# Patient Record
Sex: Male | Born: 1960 | Hispanic: Yes | Marital: Married
Health system: Southern US, Community
[De-identification: ages and names within clinical notes are randomized; demographics above are authoritative.]

## PROBLEM LIST (undated history)

## (undated) ENCOUNTER — Emergency Department (HOSPITAL_COMMUNITY): Payer: Self-pay

---

## 2000-01-28 ENCOUNTER — Encounter: Payer: Self-pay | Admitting: Emergency Medicine

## 2000-01-28 ENCOUNTER — Emergency Department (HOSPITAL_COMMUNITY): Admission: EM | Admit: 2000-01-28 | Discharge: 2000-01-29 | Payer: Self-pay | Admitting: Emergency Medicine

## 2014-02-04 ENCOUNTER — Emergency Department (HOSPITAL_COMMUNITY): Payer: Self-pay | Admitting: Certified Registered Nurse Anesthetist

## 2014-02-04 ENCOUNTER — Inpatient Hospital Stay (HOSPITAL_COMMUNITY)
Admission: EM | Admit: 2014-02-04 | Discharge: 2014-02-05 | DRG: 494 | Disposition: A | Discharge status: Home / Self Care | Payer: Self-pay | Attending: Orthopedic Surgery | Admitting: Orthopedic Surgery

## 2014-02-04 ENCOUNTER — Encounter (HOSPITAL_COMMUNITY)
Admission: EM | Disposition: A | Discharge status: Home / Self Care | Payer: Self-pay | Source: Home / Self Care | Attending: Orthopedic Surgery

## 2014-02-04 ENCOUNTER — Encounter (HOSPITAL_COMMUNITY): Payer: Self-pay | Admitting: *Deleted

## 2014-02-04 ENCOUNTER — Ambulatory Visit (INDEPENDENT_AMBULATORY_CARE_PROVIDER_SITE_OTHER): Payer: Self-pay

## 2014-02-04 ENCOUNTER — Ambulatory Visit (INDEPENDENT_AMBULATORY_CARE_PROVIDER_SITE_OTHER): Payer: Self-pay | Admitting: Family Medicine

## 2014-02-04 ENCOUNTER — Inpatient Hospital Stay (HOSPITAL_COMMUNITY): Payer: Self-pay

## 2014-02-04 VITALS — BP 142/76 | HR 90 | Temp 98.3°F | Resp 60

## 2014-02-04 DIAGNOSIS — S82301A Unspecified fracture of lower end of right tibia, initial encounter for closed fracture: Secondary | ICD-10-CM

## 2014-02-04 DIAGNOSIS — S82871A Displaced pilon fracture of right tibia, initial encounter for closed fracture: Principal | ICD-10-CM | POA: Diagnosis present

## 2014-02-04 DIAGNOSIS — W11XXXA Fall on and from ladder, initial encounter: Secondary | ICD-10-CM | POA: Diagnosis present

## 2014-02-04 DIAGNOSIS — M25571 Pain in right ankle and joints of right foot: Secondary | ICD-10-CM

## 2014-02-04 HISTORY — PX: EXTERNAL FIXATION LEG: SHX1549

## 2014-02-04 LAB — I-STAT CHEM 8, ED
BUN: 21 mg/dL (ref 6–23)
CHLORIDE: 100 meq/L (ref 96–112)
Calcium, Ion: 1.25 mmol/L — ABNORMAL HIGH (ref 1.12–1.23)
Creatinine, Ser: 0.7 mg/dL (ref 0.50–1.35)
Glucose, Bld: 93 mg/dL (ref 70–99)
HEMATOCRIT: 49 % (ref 39.0–52.0)
Hemoglobin: 16.7 g/dL (ref 13.0–17.0)
POTASSIUM: 3.9 meq/L (ref 3.7–5.3)
SODIUM: 140 meq/L (ref 137–147)
TCO2: 25 mmol/L (ref 0–100)

## 2014-02-04 LAB — CBC WITH DIFFERENTIAL/PLATELET
BASOS ABS: 0 10*3/uL (ref 0.0–0.1)
BASOS PCT: 0 % (ref 0–1)
Eosinophils Absolute: 0.1 10*3/uL (ref 0.0–0.7)
Eosinophils Relative: 1 % (ref 0–5)
HCT: 44.1 % (ref 39.0–52.0)
Hemoglobin: 14.7 g/dL (ref 13.0–17.0)
Lymphocytes Relative: 16 % (ref 12–46)
Lymphs Abs: 1.5 10*3/uL (ref 0.7–4.0)
MCH: 29.6 pg (ref 26.0–34.0)
MCHC: 33.3 g/dL (ref 30.0–36.0)
MCV: 88.7 fL (ref 78.0–100.0)
Monocytes Absolute: 0.6 10*3/uL (ref 0.1–1.0)
Monocytes Relative: 6 % (ref 3–12)
NEUTROS ABS: 7.3 10*3/uL (ref 1.7–7.7)
NEUTROS PCT: 77 % (ref 43–77)
Platelets: 240 10*3/uL (ref 150–400)
RBC: 4.97 MIL/uL (ref 4.22–5.81)
RDW: 12.4 % (ref 11.5–15.5)
WBC: 9.4 10*3/uL (ref 4.0–10.5)

## 2014-02-04 SURGERY — EXTERNAL FIXATION, LOWER EXTREMITY
Anesthesia: General | Site: Leg Lower | Laterality: Right

## 2014-02-04 MED ORDER — MORPHINE SULFATE 2 MG/ML IJ SOLN
1.0000 mg | INTRAMUSCULAR | Status: DC | PRN
  Administered 2014-02-05: 1 mg via INTRAVENOUS

## 2014-02-04 MED ORDER — ONDANSETRON HCL 4 MG/2ML IJ SOLN
INTRAMUSCULAR | Status: DC | PRN
  Administered 2014-02-04: 4 mg via INTRAVENOUS

## 2014-02-04 MED ORDER — ONDANSETRON HCL 4 MG/2ML IJ SOLN
INTRAMUSCULAR | Status: AC
  Filled 2014-02-04: qty 2

## 2014-02-04 MED ORDER — LIDOCAINE HCL (CARDIAC) 20 MG/ML IV SOLN
INTRAVENOUS | Status: AC
  Filled 2014-02-04: qty 5

## 2014-02-04 MED ORDER — PROPOFOL 10 MG/ML IV BOLUS
INTRAVENOUS | Status: DC | PRN
  Administered 2014-02-04: 200 mg via INTRAVENOUS

## 2014-02-04 MED ORDER — GLYCOPYRROLATE 0.2 MG/ML IJ SOLN
INTRAMUSCULAR | Status: AC
  Filled 2014-02-04: qty 3

## 2014-02-04 MED ORDER — ROCURONIUM BROMIDE 50 MG/5ML IV SOLN
INTRAVENOUS | Status: AC
  Filled 2014-02-04: qty 1

## 2014-02-04 MED ORDER — OXYCODONE HCL 5 MG PO TABS: 5.0000 mg | ORAL_TABLET | Freq: Once | ORAL | Status: DC | PRN

## 2014-02-04 MED ORDER — DOCUSATE SODIUM 100 MG PO CAPS
100.0000 mg | ORAL_CAPSULE | Freq: Two times a day (BID) | ORAL | Status: DC
  Administered 2014-02-05: 100 mg via ORAL
  Filled 2014-02-04 (×2): qty 1

## 2014-02-04 MED ORDER — GLYCOPYRROLATE 0.2 MG/ML IJ SOLN
INTRAMUSCULAR | Status: DC | PRN
  Administered 2014-02-04: .4 mg via INTRAVENOUS

## 2014-02-04 MED ORDER — LACTATED RINGERS IV SOLN
INTRAVENOUS | Status: DC | PRN
  Administered 2014-02-04: 20:00:00 via INTRAVENOUS

## 2014-02-04 MED ORDER — MIDAZOLAM HCL 5 MG/5ML IJ SOLN
INTRAMUSCULAR | Status: DC | PRN
  Administered 2014-02-04: 2 mg via INTRAVENOUS

## 2014-02-04 MED ORDER — SUCCINYLCHOLINE CHLORIDE 20 MG/ML IJ SOLN
INTRAMUSCULAR | Status: DC | PRN
  Administered 2014-02-04: 120 mg via INTRAVENOUS

## 2014-02-04 MED ORDER — HYDROMORPHONE HCL 1 MG/ML IJ SOLN
INTRAMUSCULAR | Status: AC
  Filled 2014-02-04: qty 1

## 2014-02-04 MED ORDER — NEOSTIGMINE METHYLSULFATE 10 MG/10ML IV SOLN
INTRAVENOUS | Status: AC
  Filled 2014-02-04: qty 1

## 2014-02-04 MED ORDER — PHENYLEPHRINE HCL 10 MG/ML IJ SOLN
INTRAMUSCULAR | Status: DC | PRN
  Administered 2014-02-04: 40 ug via INTRAVENOUS
  Administered 2014-02-04 (×2): 80 ug via INTRAVENOUS

## 2014-02-04 MED ORDER — SENNA 8.6 MG PO TABS
2.0000 | ORAL_TABLET | Freq: Two times a day (BID) | ORAL | Status: DC
  Filled 2014-02-04 (×3): qty 2

## 2014-02-04 MED ORDER — ONDANSETRON HCL 4 MG PO TABS: 4.0000 mg | ORAL_TABLET | Freq: Four times a day (QID) | ORAL | Status: DC | PRN

## 2014-02-04 MED ORDER — LIDOCAINE HCL (CARDIAC) 20 MG/ML IV SOLN
INTRAVENOUS | Status: DC | PRN
Start: 2014-02-04 — End: 2014-02-04
  Administered 2014-02-04: 100 mg via INTRAVENOUS

## 2014-02-04 MED ORDER — OXYCODONE HCL 5 MG/5ML PO SOLN: 5.0000 mg | Freq: Once | ORAL | Status: DC | PRN

## 2014-02-04 MED ORDER — OXYCODONE-ACETAMINOPHEN 5-325 MG PO TABS
1.0000 | ORAL_TABLET | ORAL | Status: DC | PRN
  Administered 2014-02-05 (×2): 2 via ORAL

## 2014-02-04 MED ORDER — INFLUENZA VAC SPLIT QUAD 0.5 ML IM SUSY
0.5000 mL | PREFILLED_SYRINGE | INTRAMUSCULAR | Status: DC
  Filled 2014-02-04: qty 0.5

## 2014-02-04 MED ORDER — CEFAZOLIN SODIUM-DEXTROSE 2-3 GM-% IV SOLR
INTRAVENOUS | Status: DC | PRN
  Administered 2014-02-04: 2 g via INTRAVENOUS

## 2014-02-04 MED ORDER — HYDROMORPHONE HCL 1 MG/ML IJ SOLN
0.2500 mg | INTRAMUSCULAR | Status: DC | PRN
  Administered 2014-02-04 (×2): 0.5 mg via INTRAVENOUS

## 2014-02-04 MED ORDER — FENTANYL CITRATE 0.05 MG/ML IJ SOLN
INTRAMUSCULAR | Status: DC | PRN
  Administered 2014-02-04 (×3): 50 ug via INTRAVENOUS

## 2014-02-04 MED ORDER — ONDANSETRON HCL 4 MG/2ML IJ SOLN: 4.0000 mg | Freq: Once | INTRAMUSCULAR | Status: DC | PRN

## 2014-02-04 MED ORDER — FENTANYL CITRATE 0.05 MG/ML IJ SOLN
INTRAMUSCULAR | Status: AC
  Filled 2014-02-04: qty 5

## 2014-02-04 MED ORDER — ROCURONIUM BROMIDE 100 MG/10ML IV SOLN
INTRAVENOUS | Status: DC | PRN
  Administered 2014-02-04: 5 mg via INTRAVENOUS
  Administered 2014-02-04: 20 mg via INTRAVENOUS
  Administered 2014-02-04: 5 mg via INTRAVENOUS

## 2014-02-04 MED ORDER — ARTIFICIAL TEARS OP OINT
TOPICAL_OINTMENT | OPHTHALMIC | Status: DC | PRN
  Administered 2014-02-04: 1 via OPHTHALMIC

## 2014-02-04 MED ORDER — PROPOFOL 10 MG/ML IV BOLUS
INTRAVENOUS | Status: AC
  Filled 2014-02-04: qty 20

## 2014-02-04 MED ORDER — ENOXAPARIN SODIUM 40 MG/0.4ML ~~LOC~~ SOLN
40.0000 mg | SUBCUTANEOUS | Status: DC
  Filled 2014-02-04 (×2): qty 0.4

## 2014-02-04 MED ORDER — BACITRACIN ZINC 500 UNIT/GM EX OINT
TOPICAL_OINTMENT | CUTANEOUS | Status: AC
  Filled 2014-02-04: qty 15

## 2014-02-04 MED ORDER — ONDANSETRON HCL 4 MG/2ML IJ SOLN: 4.0000 mg | Freq: Four times a day (QID) | INTRAMUSCULAR | Status: DC | PRN

## 2014-02-04 MED ORDER — NEOSTIGMINE METHYLSULFATE 10 MG/10ML IV SOLN
INTRAVENOUS | Status: DC | PRN
  Administered 2014-02-04: 3 mg via INTRAVENOUS

## 2014-02-04 MED ORDER — MIDAZOLAM HCL 2 MG/2ML IJ SOLN
INTRAMUSCULAR | Status: AC
  Filled 2014-02-04: qty 2

## 2014-02-04 MED ORDER — SODIUM CHLORIDE 0.9 % IV SOLN
INTRAVENOUS | Status: DC
  Administered 2014-02-05: 01:00:00 via INTRAVENOUS

## 2014-02-04 MED ORDER — GLYCOPYRROLATE 0.2 MG/ML IJ SOLN
INTRAMUSCULAR | Status: AC
  Filled 2014-02-04: qty 2

## 2014-02-04 MED ORDER — 0.9 % SODIUM CHLORIDE (POUR BTL) OPTIME
TOPICAL | Status: DC | PRN
  Administered 2014-02-04: 1000 mL

## 2014-02-04 SURGICAL SUPPLY — 64 items
BANDAGE ELASTIC 4 VELCRO ST LF (GAUZE/BANDAGES/DRESSINGS) ×6 IMPLANT
BAR GLASS FIBER EXFX 11X350 (EXFIX) ×6 IMPLANT
BIT DRILL CANN MED FLUTE 4.0 (BIT) ×1 IMPLANT
BLADE SURG 10 STRL SS (BLADE) ×3 IMPLANT
BNDG COHESIVE 4X5 TAN STRL (GAUZE/BANDAGES/DRESSINGS) ×3 IMPLANT
BNDG COHESIVE 6X5 TAN STRL LF (GAUZE/BANDAGES/DRESSINGS) ×3 IMPLANT
BNDG GAUZE ELAST 4 BULKY (GAUZE/BANDAGES/DRESSINGS) ×6 IMPLANT
CANISTER SUCT 3000ML (MISCELLANEOUS) ×3 IMPLANT
CHLORAPREP W/TINT 26ML (MISCELLANEOUS) ×3 IMPLANT
CLAMP BLUE BAR TO PIN (MISCELLANEOUS) ×6 IMPLANT
COVER SURGICAL LIGHT HANDLE (MISCELLANEOUS) ×3 IMPLANT
CUFF TOURNIQUET SINGLE 34IN LL (TOURNIQUET CUFF) ×3 IMPLANT
CUFF TOURNIQUET SINGLE 44IN (TOURNIQUET CUFF) IMPLANT
DRAPE C-ARM 42X72 X-RAY (DRAPES) IMPLANT
DRAPE OEC MINIVIEW 54X84 (DRAPES) ×3 IMPLANT
DRAPE U-SHAPE 47X51 STRL (DRAPES) ×3 IMPLANT
DRILL CANN 4.0MM (BIT) ×3
DRSG ADAPTIC 3X8 NADH LF (GAUZE/BANDAGES/DRESSINGS) IMPLANT
DRSG PAD ABDOMINAL 8X10 ST (GAUZE/BANDAGES/DRESSINGS) IMPLANT
ELECT REM PT RETURN 9FT ADLT (ELECTROSURGICAL) ×3
ELECTRODE REM PT RTRN 9FT ADLT (ELECTROSURGICAL) ×1 IMPLANT
GAUZE SPONGE 4X4 12PLY STRL (GAUZE/BANDAGES/DRESSINGS) IMPLANT
GAUZE XEROFORM 5X9 LF (GAUZE/BANDAGES/DRESSINGS) ×3 IMPLANT
GLOVE BIO SURGEON STRL SZ7 (GLOVE) ×6 IMPLANT
GLOVE BIO SURGEON STRL SZ8 (GLOVE) ×3 IMPLANT
GLOVE BIOGEL PI IND STRL 6.5 (GLOVE) ×1 IMPLANT
GLOVE BIOGEL PI IND STRL 7.0 (GLOVE) ×2 IMPLANT
GLOVE BIOGEL PI IND STRL 7.5 (GLOVE) ×1 IMPLANT
GLOVE BIOGEL PI IND STRL 8 (GLOVE) ×1 IMPLANT
GLOVE BIOGEL PI INDICATOR 6.5 (GLOVE) ×2
GLOVE BIOGEL PI INDICATOR 7.0 (GLOVE) ×4
GLOVE BIOGEL PI INDICATOR 7.5 (GLOVE) ×2
GLOVE BIOGEL PI INDICATOR 8 (GLOVE) ×2
GLOVE ECLIPSE 6.5 STRL STRAW (GLOVE) ×3 IMPLANT
GOWN STRL REUS W/ TWL LRG LVL3 (GOWN DISPOSABLE) ×2 IMPLANT
GOWN STRL REUS W/ TWL XL LVL3 (GOWN DISPOSABLE) ×1 IMPLANT
GOWN STRL REUS W/TWL LRG LVL3 (GOWN DISPOSABLE) ×4
GOWN STRL REUS W/TWL XL LVL3 (GOWN DISPOSABLE) ×2
HALF PIN 5.0X160 (PIN) ×6 IMPLANT
KIT BASIN OR (CUSTOM PROCEDURE TRAY) ×3 IMPLANT
KIT ROOM TURNOVER OR (KITS) ×3 IMPLANT
NEEDLE 22X1 1/2 (OR ONLY) (NEEDLE) IMPLANT
NS IRRIG 1000ML POUR BTL (IV SOLUTION) ×3 IMPLANT
PACK ORTHO EXTREMITY (CUSTOM PROCEDURE TRAY) ×3 IMPLANT
PAD ARMBOARD 7.5X6 YLW CONV (MISCELLANEOUS) ×6 IMPLANT
PAD CAST 4YDX4 CTTN HI CHSV (CAST SUPPLIES) ×1 IMPLANT
PADDING CAST ABS 4INX4YD NS (CAST SUPPLIES) ×4
PADDING CAST ABS COTTON 4X4 ST (CAST SUPPLIES) ×2 IMPLANT
PADDING CAST COTTON 4X4 STRL (CAST SUPPLIES) ×2
PIN CLAMP 2BAR 75MM BLUE (PIN) ×3 IMPLANT
PIN TRANSFIXING 5.0 (PIN) ×3 IMPLANT
SOAP 2 % CHG 4 OZ (WOUND CARE) ×3 IMPLANT
STAPLER VISISTAT 35W (STAPLE) IMPLANT
SUCTION FRAZIER TIP 10 FR DISP (SUCTIONS) IMPLANT
SUT PROLENE 3 0 PS 2 (SUTURE) ×3 IMPLANT
SUT VIC AB 3-0 PS2 18 (SUTURE)
SUT VIC AB 3-0 PS2 18XBRD (SUTURE) IMPLANT
SYR CONTROL 10ML LL (SYRINGE) IMPLANT
TOWEL OR 17X24 6PK STRL BLUE (TOWEL DISPOSABLE) ×3 IMPLANT
TOWEL OR 17X26 10 PK STRL BLUE (TOWEL DISPOSABLE) ×3 IMPLANT
TUBE CONNECTING 12'X1/4 (SUCTIONS)
TUBE CONNECTING 12X1/4 (SUCTIONS) IMPLANT
WATER STERILE IRR 1000ML POUR (IV SOLUTION) IMPLANT
XTRAFIX 11MM BAR X 350 ×6 IMPLANT

## 2014-02-04 NOTE — H&P (Signed)
Michael Harper is an 53 y.o. male.   Chief Complaint: right ankle injury HPI: 53 y/o male fell off a ladder today about 4:30 pm.  He c/o aching pain in the leg that is worse with motion and better with rest.  He denies any h/o injury to the leg in the past.  He denies any medical problems.  He denies any h/o surgery.  He is not a smoker.  He is accompanied by a friend who is interpreting.  History reviewed. No pertinent past medical history.  History reviewed. No pertinent past surgical history.  History reviewed. No pertinent family history.  No diabetes or htn in parents.  Social History:  reports that he has never smoked. He does not have any smokeless tobacco history on file. His alcohol and drug histories are not on file.  He works as a Education administratorpainter.  Allergies: No Known Allergies   (Not in a hospital admission)  No results found for this or any previous visit (from the past 48 hour(s)). Dg Ankle Complete Right  02/04/2014   CLINICAL DATA:  Larey SeatFell downstairs today. Injured ankle. Pain and swelling.  EXAM: RIGHT ANKLE - COMPLETE 3+ VIEW  COMPARISON:  None.  FINDINGS: There is a comminuted and displaced pilon type fracture involving the distal tibia. The fibula is intact. No talus fracture. The subtalar joints are maintained.  IMPRESSION: Complex comminuted pilon type fracture of the distal tibia. CT suggested for further evaluation.  Intact fibula and talus.   Electronically Signed   By: Loralie ChampagneMark  Gallerani M.D.   On: 02/04/2014 17:40    ROS  No recent f/c/nv/wt loss  Blood pressure 137/84, pulse 81, temperature 98.3 F (36.8 C), temperature source Oral, resp. rate 20, SpO2 100 %. Physical Exam  wn wd male in nad.  A and O x 4.  Mood and affect normal.  EOMI.  resp unlabored.  R LE immobilized in a splint.  Toes with brisk cap refill.  Sens to LT intact at the foot.  5/5 strength in PF and DF of the toes.  No lymphadenopathy.    Assessment/Plan R tibial pilon fracture - closed, displaced - to OR  for closed reduction and external fixation.  The risks and benefits of the alternative treatment options have been discussed in detail.  The patient wishes to proceed with surgery and specifically understands risks of bleeding, infection, nerve damage, blood clots, need for additional surgery, amputation and death.   Toni ArthursHEWITT, Keelin Sheridan 02/04/2014, 7:52 PM

## 2014-02-04 NOTE — Transfer of Care (Signed)
Immediate Anesthesia Transfer of Care Note  Patient: Michael Harper  Procedure(s) Performed: Procedure(s): CLOSED REDUCTION, EXTERNAL FIXATION LEG (Right)  Patient Location: PACU  Anesthesia Type:General  Level of Consciousness: awake, alert  and oriented  Airway & Oxygen Therapy: Patient Spontanous Breathing and Patient connected to nasal cannula oxygen  Post-op Assessment: Report given to PACU RN and Post -op Vital signs reviewed and stable  Post vital signs: Reviewed and stable  Complications: No apparent anesthesia complications   Pt denies pain. spo2 100.

## 2014-02-04 NOTE — ED Notes (Signed)
Dr. Delia ChimesHewitt/ortho notified that patient has arrived

## 2014-02-04 NOTE — Brief Op Note (Signed)
02/04/2014  10:05 PM  PATIENT:  Michael Harper  53 y.o. male  PRE-OPERATIVE DIAGNOSIS:  Right tibial pilon fracture  POST-OPERATIVE DIAGNOSIS:  Right Tibial Pilon fracture  Procedure(s): 1.  Closed reduction of right tibial pilon fracture   2.  Application of multiplane external fixator to the R LE   3.  AP and lateral xrays of the right ankle  SURGEON:  Toni ArthursJohn Jewell Haught, MD  ASSISTANT: n/a  ANESTHESIA:   General  EBL:  minimal   TOURNIQUET:  n/a  COMPLICATIONS:  None apparent  DISPOSITION:  Extubated, awake and stable to recovery.  DICTATION ID:  [id number]

## 2014-02-04 NOTE — ED Notes (Signed)
Pt in from urgent care after a fall earlier today, pt has a fracture to his right lower leg, pt sent here to be seen by MD Victorino DikeHewitt and taken to OR

## 2014-02-04 NOTE — Anesthesia Preprocedure Evaluation (Signed)
Anesthesia Evaluation  Patient identified by MRN, date of birth, ID band Patient awake    Reviewed: Allergy & Precautions, H&P , NPO status , Patient's Chart, lab work & pertinent test results  Airway Mallampati: I TM Distance: >3 FB Neck ROM: Full    Dental  (+) Teeth Intact, Dental Advisory Given   Pulmonary  breath sounds clear to auscultation        Cardiovascular Rhythm:Regular Rate:Normal     Neuro/Psych    GI/Hepatic   Endo/Other    Renal/GU      Musculoskeletal   Abdominal   Peds  Hematology   Anesthesia Other Findings   Reproductive/Obstetrics                           Anesthesia Physical Anesthesia Plan  ASA: I  Anesthesia Plan: General   Post-op Pain Management:    Induction: Intravenous  Airway Management Planned: Oral ETT  Additional Equipment:   Intra-op Plan:   Post-operative Plan: Extubation in OR  Informed Consent: I have reviewed the patients History and Physical, chart, labs and discussed the procedure including the risks, benefits and alternatives for the proposed anesthesia with the patient or authorized representative who has indicated his/her understanding and acceptance.   Dental advisory given  Plan Discussed with: CRNA, Anesthesiologist and Surgeon  Anesthesia Plan Comments:         Anesthesia Quick Evaluation  

## 2014-02-04 NOTE — Anesthesia Procedure Notes (Signed)
Procedure Name: Intubation Performed by: Minus LibertyAVENEL, Orpah Hausner Pre-anesthesia Checklist: Patient identified, Timeout performed, Emergency Drugs available, Suction available and Patient being monitored Patient Re-evaluated:Patient Re-evaluated prior to inductionOxygen Delivery Method: Circle system utilized Preoxygenation: Pre-oxygenation with 100% oxygen Intubation Type: IV induction Ventilation: Mask ventilation without difficulty Laryngoscope Size: Mac and 4 Grade View: Grade III Tube type: Oral Tube size: 7.0 mm Number of attempts: 1 Airway Equipment and Method: Stylet Placement Confirmation: ETT inserted through vocal cords under direct vision,  breath sounds checked- equal and bilateral,  positive ETCO2 and CO2 detector Secured at: 22 cm Tube secured with: Tape Dental Injury: Teeth and Oropharynx as per pre-operative assessment  Comments: Front teeth chipped- same as pre-op assessment

## 2014-02-04 NOTE — Progress Notes (Signed)
   Subjective:    Patient ID: Michael Harper, male    DOB: October 27, 1960, 53 y.o.   MRN: [id number]  HPI  Pt presents to clinic with right ankle pain after falling down the steps at his apt complex.  He fell down about 8 steps and landed on his foot which stopped his fall.  He has a scrap on his lateral right thigh that he is not worried about.  He is sore but his right ankle is what hurts.  He cannot bear weight on his ankle.  He had immediate swelling.  He has used no ice.  He has taken motrin 800mg  at home.  Review of Systems  Musculoskeletal: Positive for joint swelling and gait problem.      Objective:   Physical Exam  Constitutional: He is oriented to person, place, and time. He appears well-developed and well-nourished.  BP 142/76 mmHg  Pulse 90  Temp(Src) 98.3 F (36.8 C)  Resp 60    HENT:  Head: Normocephalic and atraumatic.  Right Ear: External ear normal.  Left Ear: External ear normal.  Eyes: Conjunctivae are normal.  Neck: Normal range of motion.  Pulmonary/Chest: Effort normal.  Musculoskeletal:       Right ankle: He exhibits decreased range of motion and swelling. He exhibits no ecchymosis, no deformity and no laceration. Tenderness. Lateral malleolus and medial malleolus tenderness found. Achilles tendon exhibits normal Thompson's test results (mild TTP in his calf but pain is mainly in his ankle with calf palpation).       Right foot: There is decreased range of motion. There is no tenderness.  Good capillary refill.  Neurological: He is alert and oriented to person, place, and time.  Skin: Skin is warm and dry.  Extremely dry skin on feet.  Hands covered in thick dried paint.  Psychiatric: He has a normal mood and affect. His behavior is normal. Judgment and thought content normal.    [facility] reading (PRIMARY) by  Dr. Katrinka BlazingSmith.  Distal tibia fracture in multiple places with dorsal angulation.    Assessment & Plan:  Pain in joint, ankle and foot, right - Plan: DG Ankle  Complete Right  Closed fracture of distal tibia, right, initial encounter   Posterior splint applied - pt has never walked on crutches and I did not feel that now was the time to teach him.  [facility].  Dr Victorino DikeHewitt is on call and in surgery currently and he knows about patient.  Pt was instructed to eat nothing.    Benny LennertSarah Weber PA-C  [facility] 02/04/2014 6:22 PM

## 2014-02-04 NOTE — Anesthesia Postprocedure Evaluation (Signed)
  Anesthesia Post-op Note  Patient: Michael Harper  Procedure(s) Performed: Procedure(s): CLOSED REDUCTION, EXTERNAL FIXATION LEG (Right)  Patient Location: PACU  Anesthesia Type: General   Level of Consciousness: awake, alert  and oriented  Airway and Oxygen Therapy: Patient Spontanous Breathing  Post-op Pain: mild  Post-op Assessment: Post-op Vital signs reviewed  Post-op Vital Signs: Reviewed  Last Vitals:  Filed Vitals:   02/04/14 2215  BP: 148/93  Pulse: 68  Temp:   Resp: 13    Complications: No apparent anesthesia complications

## 2014-02-04 NOTE — ED Provider Notes (Signed)
CSN: [id number]     Arrival date & time 02/04/14  1910 History  This chart was scribed for non-physician practitioner, Jaynie Crumbleatyana Toccara Alford, PA-C, working with Toni ArthursJohn Hewitt, MD, by Bronson CurbJacqueline Melvin, ED Scribe. This patient was seen in room TR07C/TR07C and the patient's care was started at 7:46 PM.   Chief Complaint  Patient presents with  . Leg Injury    The history is provided by the patient. No language interpreter was used.     HPI Comments: Michael PionJose Harper is a 53 y.o. male who presents to the Emergency Department for a right lower leg injury that occurred PTA after falling from a ladder. Patient was sent here from Urgent Care to be seen by Dr. Victorino DikeHewitt and taken to the OR. Patient states he is otherwise healthy and denies any other injuries. He states his pain is somewhat under control at this time. Patient's leg is splinted in temporary splint. States unable to bear any weight.   History reviewed. No pertinent past medical history. History reviewed. No pertinent past surgical history. History reviewed. No pertinent family history. History  Substance Use Topics  . Smoking status: Never Smoker   . Smokeless tobacco: Not on file  . Alcohol Use: Not on file    Review of Systems  Constitutional: Negative for fever and chills.  Musculoskeletal: Positive for arthralgias. Myalgias: right lower leg.  Neurological: Negative for weakness, numbness and headaches.      Allergies  Review of patient's allergies indicates no known allergies.  Home Medications   Prior to Admission medications   Not on File   Triage Vitals: BP 137/84 mmHg  Pulse 81  Temp(Src) 98.3 F (36.8 C) (Oral)  Resp 20  SpO2 100%  Physical Exam  Constitutional: He is oriented to person, place, and time. He appears well-developed and well-nourished. No distress.  HENT:  Head: Normocephalic and atraumatic.  Eyes: Conjunctivae and EOM are normal.  Neck: Neck supple. No tracheal deviation present.  Cardiovascular:  Normal rate.   Pulmonary/Chest: Effort normal. No respiratory distress.  Musculoskeletal: Normal range of motion.  Right leg in the short leg splint. Toes are pink, cap refill less than 2 seconds  Neurological: He is alert and oriented to person, place, and time.  Skin: Skin is warm and dry.  Psychiatric: He has a normal mood and affect. His behavior is normal.  Nursing note and vitals reviewed.   ED Course  Procedures (including critical care time)  DIAGNOSTIC STUDIES: Oxygen Saturation is 100% on room air, normal by my interpretation.    COORDINATION OF CARE: At 1949 Discussed treatment plan with patient. Patient agrees.   Labs Review Labs Reviewed - No data to display  Imaging Review Dg Ankle Complete Right  02/04/2014   CLINICAL DATA:  Larey SeatFell downstairs today. Injured ankle. Pain and swelling.  EXAM: RIGHT ANKLE - COMPLETE 3+ VIEW  COMPARISON:  None.  FINDINGS: There is a comminuted and displaced pilon type fracture involving the distal tibia. The fibula is intact. No talus fracture. The subtalar joints are maintained.  IMPRESSION: Complex comminuted pilon type fracture of the distal tibia. CT suggested for further evaluation.  Intact fibula and talus.   Electronically Signed   By: Loralie ChampagneMark  Gallerani M.D.   On: 02/04/2014 17:40     EKG Interpretation None      MDM   Final diagnoses:  Pilon fracture of right tibia, closed, initial encounter    Patient is here as a transfer from urgent care, for Dr. Jearl KlinefelterSiewert to  take to the OR for unstable right ankle fracture. Patient denies any other injuries. He is alert and oriented, in no distress. Vital signs are normal. Dr. Victorino DikeHewitt is here at bedside, requesting basic labs and EKG. OR is ready for the patient. Order for IV placed, patient will be transported to the OR as soon as he is ready.  Filed Vitals:   02/04/14 2215 02/04/14 2230 02/04/14 2245 02/04/14 2300  BP: 148/93 167/96 171/94 159/83  Pulse: 68 70 67 81  Temp:   97.4 F (36.3  C) 98.5 F (36.9 C)  TempSrc:      Resp: 13 12 11 12   SpO2: 100% 100% 100% 100%    I personally performed the services described in this documentation, which was scribed in my presence. The recorded information has been reviewed and is accurate.   Lottie Musselatyana A Amberrose Friebel, PA-C 02/05/14 0103  Geoffery Lyonsouglas Delo, MD 02/07/14 1013

## 2014-02-05 ENCOUNTER — Encounter (HOSPITAL_COMMUNITY): Payer: Self-pay | Admitting: Orthopedic Surgery

## 2014-02-05 MED ORDER — DOCUSATE SODIUM 100 MG PO CAPS
ORAL_CAPSULE | ORAL | Status: AC
  Filled 2014-02-05: qty 1

## 2014-02-05 MED ORDER — OXYCODONE-ACETAMINOPHEN 5-325 MG PO TABS
ORAL_TABLET | ORAL | Status: AC
Start: 2014-02-05 — End: 2014-02-05
  Filled 2014-02-05: qty 2

## 2014-02-05 MED ORDER — OXYCODONE-ACETAMINOPHEN 5-325 MG PO TABS: 1.0000 | ORAL_TABLET | ORAL | Status: DC | PRN

## 2014-02-05 MED ORDER — MORPHINE SULFATE 2 MG/ML IJ SOLN
INTRAMUSCULAR | Status: AC
  Filled 2014-02-05: qty 1

## 2014-02-05 MED ORDER — OXYCODONE-ACETAMINOPHEN 5-325 MG PO TABS
ORAL_TABLET | ORAL | Status: AC
  Filled 2014-02-05: qty 2

## 2014-02-05 NOTE — Discharge Instructions (Signed)
Michael ArthursJohn Fauna Neuner, MD [facility]  Please read the following information regarding your care after surgery.  Medications  You only need a prescription for the narcotic pain medicine (ex. oxycodone, Percocet, Norco).  All of the other medicines listed below are available over the counter. X percocet as prescribed for pain  Narcotic pain medicine (ex. oxycodone, Percocet, Vicodin) will cause constipation.  To prevent this problem, take the following medicines while you are taking any pain medicine. X docusate sodium (Colace) 100 mg twice a day X senna (Senokot) 2 tablets twice a day  X To help prevent blood clots, take an aspirin (325 mg) once a day for a month after surgery.  You should also get up every hour while you are awake to move around.    Weight Bearing ? Bear weight when you are able on your operated leg or foot. ? Bear weight only on the heel of your operated foot in the post-op shoe. X Do not bear any weight on the operated leg or foot.  Cast / Splint / Dressing X Keep your dressing clean and dry.  Dont put anything (coat hanger, pencil, etc) down inside of it.  If it gets damp, use a hair dryer on the cool setting to dry it.  If it gets soaked, call the office to schedule an appointment for a cast change.  After your dressing, cast or splint is removed; you may shower, but do not soak or scrub the wound.  Allow the water to run over it, and then gently pat it dry.  Swelling It is normal for you to have swelling where you had surgery.  To reduce swelling and pain, keep your toes above your nose for at least 3 days after surgery.  It may be necessary to keep your foot or leg elevated for several weeks.  If it hurts, it should be elevated.  Follow Up Call my office at [phone] when you are discharged from the hospital or surgery center to schedule an appointment to be seen two weeks after surgery.  Call my office at [phone] if you develop a fever >101.5 F,  nausea, vomiting, bleeding from the surgical site or severe pain.

## 2014-02-05 NOTE — Progress Notes (Signed)
Orthopedic Tech Progress Note Patient Details:  Michael Harper [birth year] [id number]  Ortho Devices Type of Ortho Device: Crutches Ortho Device/Splint Interventions: Application Viewed order from doctor's order list  Michael Harper, Michael Harper 02/05/2014, 11:52 AM

## 2014-02-05 NOTE — Discharge Summary (Signed)
Physician Discharge Summary  Patient ID: Michael PionJose Harper MRN: [id number] DOB/AGE: 1960/10/03 53 y.o.  Admit date: 02/04/2014 Discharge date: 02/05/2014  Admission Diagnoses:  Right tibial pilon fracture  Discharge Diagnoses:  Active Problems:   Pilon fracture of right tibia s/p closed reduction and external fixaiton of right tibial pilon fracture  Discharged Condition: stable  Hospital Course: Pt was admitted and taken to the OR where he underwent closed reduction and ex fix of his right tibial pilon fracture.  He tolerated the procedure well and went to 5N post op.  He had a CT scan of the right ankle and was discharged home the following morning in sstable condition.  Consults: None  Significant Diagnostic Studies: xrays - right tibia pilon fracture  Treatments: surgery:  As above  Discharge Exam: Blood pressure 137/83, pulse 63, temperature 97.7 F (36.5 C), temperature source Oral, resp. rate 14, SpO2 100 %. Extremities: R LE immobilize in ex fix.  Normal NV exam.  Dressing in place.  Disposition: 01-Home or Self Care  Discharge Instructions    Call MD / Call 911    Complete by:  As directed   If you experience chest pain or shortness of breath, CALL 911 and be transported to the hospital emergency room.  If you develope a fever above 101 F, pus (white drainage) or increased drainage or redness at the wound, or calf pain, call your surgeon's office.     Constipation Prevention    Complete by:  As directed   Drink plenty of fluids.  Prune juice may be helpful.  You may use a stool softener, such as Colace (over the counter) 100 mg twice a day.  Use MiraLax (over the counter) for constipation as needed.     Diet - low sodium heart healthy    Complete by:  As directed      Increase activity slowly as tolerated    Complete by:  As directed             Medication List    TAKE these medications        oxyCODONE-acetaminophen 5-325 MG per tablet  Commonly known as:   PERCOCET/ROXICET  Take 1-2 tablets by mouth every 4 (four) hours as needed for moderate pain or severe pain.         SignedToni Arthurs: Verdella Laidlaw 02/05/2014, 9:40 PM

## 2014-02-06 NOTE — Op Note (Signed)
NAMBunnie Pion:  Cremer, Lorimer                ACCOUNT NO.:  [id number]  MEDICAL RECORD NO.:  [id number]  LOCATION:  5N04C                        FACILITY:  [facility]  PHYSICIAN:  Toni ArthursJohn Gunnison Chahal, MD        DATE OF BIRTH:  09-02-1960  DATE OF PROCEDURE:  02/05/2014 DATE OF DISCHARGE:                              OPERATIVE REPORT   PREOPERATIVE DIAGNOSIS:  Right tibial pilon fracture - closed, displaced.  POSTOPERATIVE DIAGNOSIS:  Right tibial pilon fracture - closed, displaced.  PROCEDURE: 1. Closed reduction of right tibial pilon fracture under anesthesia. 2. Application of multiplanar external fixator to the right lower     extremity. 3. AP, mortise, and lateral radiographs of the right ankle.  SURGEON:  Toni ArthursJohn Shaleta Ruacho, MD  ANESTHESIA:  General.  ESTIMATED BLOOD LOSS:  Minimal.  TOURNIQUET TIME:  Zero.  COMPLICATIONS:  None apparent.  DISPOSITION:  Extubated, awake, and stable to recovery.  INDICATIONS FOR PROCEDURE:  The patient is a 53 year old male without significant past medical history.  He fell about 8 feet from a ladder landing on his right foot.  He was seen at urgent care, and immobilized in a splint.  I was contacted and radiographs revealed a tibial pilon fracture.  He presents now for operative treatment of this displaced comminuted intra-articular distal fibular fracture.  He understands the risks, benefits, the alternative treatment options, elects surgical treatment.  He specifically understands risks of bleeding, infection, nerve damage, blood clots, need for additional surgery, continued pain, amputation, and death.  PROCEDURE IN DETAIL:  After preoperative consent was obtained and the correct operative site was identified, the patient was brought to the operating room and placed supine on the operating table.  General anesthesia was induced.  Preoperative antibiotics were administered. Surgical time-out was taken.  Right lower extremity was prepped and draped in  standard sterile fashion with tourniquet around the thigh. The level of the fracture was identified on fluoroscopy.  A Biomet Zimmer external fixator was used to create a delta frame construct at the right lower extremity.  Proximally, a pin-to-bar block was selected and affixed to the tibia with 2 Schanz pins inserted percutaneously.  A centrally threaded calcaneal pin was then inserted through a stab incision at the lateral aspect of the calcaneus tuberosity.  It was inserted to the skin medially when the skin was incised.  The pin was passed through and were centered in the tuberosity.  The delta frame construct was then assembled.  Longitudinal traction and posterior pressure was then applied.  The frame was tightened.  AP, mortise, and lateral radiographs confirmed appropriate reduction of the fracture and appropriate position and length of the Schanz pins.  The wounds were irrigated copiously and dressed with Xeroform and gauze.  Sterile dressings were applied followed by compression wrap.  The patient was awakened from anesthesia and transported to the recovery room in stable condition.  FOLLOWUP PLAN:  The patient will be nonweightbearing on the right lower extremity.  He will follow up with me in a week or two to schedule alternative treatment of this comminuted intra-articular fracture.  RADIOGRAPHS:  AP, mortise, and lateral radiographs of the right ankle were obtained intraoperatively  today.  These show interval reduction with improved alignment of the comminuted distal tibia fracture.  No fracture of the fibula is noted.  No other acute injury is noted.     Toni ArthursJohn Necole Minassian, MD     JH/MEDQ  D:  02/05/2014  T:  02/06/2014  Job:  [id number]

## 2014-02-13 NOTE — Pre-Procedure Instructions (Addendum)
Michael Harper  02/13/2014   Your procedure is scheduled on:  Tuesday, December 22  [facility]  Call this number if you have problems the morning of surgery: [phone]   Remember:   Do not eat food or drink liquids after midnight.   Take these medicines the morning of surgery with A SIP OF WATER: Oxycodone (if needed)   STOP/ Do not take Aspirin, Aleve, Naproxen, Advil, Ibuprofen, Motrin, Vitamins, Herbs, or Supplements starting today   Do not wear jewelry.  Do not wear lotions, powders, or perfumes. You may wear deodorant.  Do not shave 48 hours prior to surgery. Men may shave face and neck.  Do not bring valuables to the hospital.  [facility] is not responsible  for any belongings or valuables.               Contacts, dentures or bridgework may not be worn into surgery.  Leave suitcase in the car. After surgery it may be brought to your room.  For patients admitted to the hospital, discharge time is determined by your treatment team.               Patients discharged the day of surgery will not be allowed to drive home.  Name and phone number of your driver: Family/ Friend  Special Instructions: [place] - Preparing for Surgery  Before surgery, you can play an important role.  Because skin is not sterile, your skin needs to be as free of germs as possible.  You can reduce the number of germs on you skin by washing with CHG (chlorahexidine gluconate) soap before surgery.  CHG is an antiseptic cleaner which kills germs and bonds with the skin to continue killing germs even after washing.  Please DO NOT use if you have an allergy to CHG or antibacterial soaps.  If your skin becomes reddened/irritated stop using the CHG and inform your nurse when you arrive at Short Stay.  Do not shave (including legs and underarms) for at least 48 hours prior to the first CHG shower.  You may shave your face.  Please follow these instructions  carefully:   1.  Shower with CHG Soap the night before surgery and the morning of Surgery.  2.  If you choose to wash your hair, wash your hair first as usual with your normal shampoo.  3.  After you shampoo, rinse your hair and body thoroughly to remove the shampoo.  4.  Use CHG as you would any other liquid soap.  You can apply CHG directly to the skin and wash gently with scrungie or a clean washcloth.  5.  Apply the CHG Soap to your body ONLY FROM THE NECK DOWN.  Do not use on open wounds or open sores.  Avoid contact with your eyes, ears, mouth and genitals (private parts).  Wash genitals (private parts) with your normal soap.  6.  Wash thoroughly, paying special attention to the area where your surgery will be performed.  7.  Thoroughly rinse your body with warm water from the neck down.  8.  DO NOT shower/wash with your normal soap after using and rinsing off the CHG Soap.  9.  Pat yourself dry with a clean towel.            10.  Wear clean pajamas.            11.  Place clean sheets on your bed the night of  your first shower and do not sleep with pets.  Day of Surgery  Do not apply any lotions the morning of surgery.  Please wear clean clothes to the hospital/surgery center.     Please read over the following fact sheets that you were given: Pain Booklet, Coughing and Deep Breathing and Surgical Site Infection Prevention

## 2014-02-15 ENCOUNTER — Encounter (HOSPITAL_COMMUNITY)
Admission: RE | Admit: 2014-02-15 | Discharge: 2014-02-15 | Disposition: A | Discharge status: Home / Self Care | Payer: Self-pay | Source: Ambulatory Visit | Attending: Orthopedic Surgery | Admitting: Orthopedic Surgery

## 2014-02-15 ENCOUNTER — Encounter (HOSPITAL_COMMUNITY): Payer: Self-pay

## 2014-02-15 DIAGNOSIS — Z01812 Encounter for preprocedural laboratory examination: Secondary | ICD-10-CM | POA: Insufficient documentation

## 2014-02-15 LAB — BASIC METABOLIC PANEL
Anion gap: 9 (ref 5–15)
BUN: 11 mg/dL (ref 6–23)
CHLORIDE: 102 meq/L (ref 96–112)
CO2: 28 meq/L (ref 19–32)
Calcium: 9.3 mg/dL (ref 8.4–10.5)
Creatinine, Ser: 0.62 mg/dL (ref 0.50–1.35)
GFR calc Af Amer: >90 mL/min (ref >90)
GFR calc non Af Amer: >90 mL/min (ref >90)
Glucose, Bld: 98 mg/dL (ref 70–99)
Potassium: 4.2 mEq/L (ref 3.7–5.3)
Sodium: 139 mEq/L (ref 137–147)

## 2014-02-15 LAB — CBC
HEMATOCRIT: 40 % (ref 39.0–52.0)
HEMOGLOBIN: 13.3 g/dL (ref 13.0–17.0)
MCH: 29.4 pg (ref 26.0–34.0)
MCHC: 33.3 g/dL (ref 30.0–36.0)
MCV: 88.5 fL (ref 78.0–100.0)
Platelets: 322 10*3/uL (ref 150–400)
RBC: 4.52 MIL/uL (ref 4.22–5.81)
RDW: 12 % (ref 11.5–15.5)
WBC: 8 10*3/uL (ref 4.0–10.5)

## 2014-02-15 LAB — HEPATIC FUNCTION PANEL
ALBUMIN: 3.3 g/dL — AB (ref 3.5–5.2)
ALK PHOS: 109 U/L (ref 39–117)
ALT: 41 U/L (ref 0–53)
AST: 27 U/L (ref 0–37)
BILIRUBIN TOTAL: 0.4 mg/dL (ref 0.3–1.2)
Total Protein: 7.1 g/dL (ref 6.0–8.3)

## 2014-02-15 NOTE — Progress Notes (Signed)
History and physical examinations reviewed with Benny LennertSarah Weber, PA-C.  Ankle films reviewed.  Agree with assessment and plan.

## 2014-02-15 NOTE — Progress Notes (Signed)
Patient cannot read or write - only knew birthdate by showing me his license.   No primary physician

## 2014-02-23 ENCOUNTER — Ambulatory Visit (HOSPITAL_COMMUNITY): Payer: Self-pay | Admitting: Anesthesiology

## 2014-02-23 ENCOUNTER — Ambulatory Visit (HOSPITAL_COMMUNITY): Payer: Self-pay

## 2014-02-23 ENCOUNTER — Encounter (HOSPITAL_COMMUNITY): Payer: Self-pay | Admitting: *Deleted

## 2014-02-23 ENCOUNTER — Encounter (HOSPITAL_COMMUNITY)
Admission: RE | Disposition: A | Discharge status: Home / Self Care | Payer: Self-pay | Source: Ambulatory Visit | Attending: Orthopedic Surgery

## 2014-02-23 ENCOUNTER — Observation Stay (HOSPITAL_COMMUNITY)
Admission: RE | Admit: 2014-02-23 | Discharge: 2014-02-24 | Disposition: A | Discharge status: Home / Self Care | Payer: Self-pay | Source: Ambulatory Visit | Attending: Orthopedic Surgery | Admitting: Orthopedic Surgery

## 2014-02-23 DIAGNOSIS — S82871D Displaced pilon fracture of right tibia, subsequent encounter for closed fracture with routine healing: Principal | ICD-10-CM | POA: Insufficient documentation

## 2014-02-23 DIAGNOSIS — W109XXD Fall (on) (from) unspecified stairs and steps, subsequent encounter: Secondary | ICD-10-CM | POA: Insufficient documentation

## 2014-02-23 DIAGNOSIS — Z419 Encounter for procedure for purposes other than remedying health state, unspecified: Secondary | ICD-10-CM

## 2014-02-23 DIAGNOSIS — S82871A Displaced pilon fracture of right tibia, initial encounter for closed fracture: Secondary | ICD-10-CM | POA: Diagnosis present

## 2014-02-23 HISTORY — PX: ORIF TIBIA FRACTURE: SHX5416

## 2014-02-23 SURGERY — OPEN REDUCTION INTERNAL FIXATION (ORIF) TIBIA FRACTURE
Anesthesia: Regional | Site: Foot | Laterality: Right

## 2014-02-23 MED ORDER — CHLORHEXIDINE GLUCONATE 4 % EX LIQD
60.0000 mL | Freq: Once | CUTANEOUS | Status: DC
  Filled 2014-02-23: qty 60

## 2014-02-23 MED ORDER — MIDAZOLAM HCL 2 MG/2ML IJ SOLN
INTRAMUSCULAR | Status: AC
  Filled 2014-02-23: qty 2

## 2014-02-23 MED ORDER — BACITRACIN ZINC 500 UNIT/GM EX OINT
TOPICAL_OINTMENT | CUTANEOUS | Status: AC
  Filled 2014-02-23: qty 15

## 2014-02-23 MED ORDER — SENNA 8.6 MG PO TABS
2.0000 | ORAL_TABLET | Freq: Two times a day (BID) | ORAL | Status: DC
  Administered 2014-02-23 – 2014-02-24 (×2): 17.2 mg via ORAL
  Filled 2014-02-23 (×3): qty 2

## 2014-02-23 MED ORDER — LIDOCAINE HCL (CARDIAC) 20 MG/ML IV SOLN
INTRAVENOUS | Status: DC | PRN
  Administered 2014-02-23: 60 mg via INTRAVENOUS

## 2014-02-23 MED ORDER — LACTATED RINGERS IV SOLN
INTRAVENOUS | Status: DC | PRN
  Administered 2014-02-23 (×2): via INTRAVENOUS

## 2014-02-23 MED ORDER — CEFAZOLIN SODIUM-DEXTROSE 2-3 GM-% IV SOLR
INTRAVENOUS | Status: AC
  Filled 2014-02-23: qty 50

## 2014-02-23 MED ORDER — PROPOFOL 10 MG/ML IV BOLUS
INTRAVENOUS | Status: DC | PRN
  Administered 2014-02-23: 160 mg via INTRAVENOUS

## 2014-02-23 MED ORDER — SODIUM CHLORIDE 0.9 % IV SOLN
INTRAVENOUS | Status: DC
  Administered 2014-02-23: 22:00:00 via INTRAVENOUS

## 2014-02-23 MED ORDER — DOCUSATE SODIUM 100 MG PO CAPS
100.0000 mg | ORAL_CAPSULE | Freq: Two times a day (BID) | ORAL | Status: DC
  Administered 2014-02-23 – 2014-02-24 (×2): 100 mg via ORAL
  Filled 2014-02-23 (×3): qty 1

## 2014-02-23 MED ORDER — DEXAMETHASONE SODIUM PHOSPHATE 4 MG/ML IJ SOLN
INTRAMUSCULAR | Status: AC
  Filled 2014-02-23: qty 1

## 2014-02-23 MED ORDER — ONDANSETRON HCL 4 MG/2ML IJ SOLN
INTRAMUSCULAR | Status: DC | PRN
  Administered 2014-02-23: 4 mg via INTRAVENOUS

## 2014-02-23 MED ORDER — CEFAZOLIN SODIUM-DEXTROSE 2-3 GM-% IV SOLR
2.0000 g | INTRAVENOUS | Status: AC
  Administered 2014-02-23: 2 g via INTRAVENOUS

## 2014-02-23 MED ORDER — PROMETHAZINE HCL 25 MG/ML IJ SOLN: 6.2500 mg | INTRAMUSCULAR | Status: DC | PRN

## 2014-02-23 MED ORDER — DEXAMETHASONE SODIUM PHOSPHATE 4 MG/ML IJ SOLN
INTRAMUSCULAR | Status: DC | PRN
  Administered 2014-02-23: 4 mg via INTRAVENOUS

## 2014-02-23 MED ORDER — ONDANSETRON HCL 4 MG/2ML IJ SOLN
INTRAMUSCULAR | Status: AC
  Filled 2014-02-23: qty 2

## 2014-02-23 MED ORDER — ACETAMINOPHEN 325 MG PO TABS: 325.0000 mg | ORAL_TABLET | ORAL | Status: DC | PRN

## 2014-02-23 MED ORDER — ONDANSETRON HCL 4 MG PO TABS: 4.0000 mg | ORAL_TABLET | Freq: Four times a day (QID) | ORAL | Status: DC | PRN

## 2014-02-23 MED ORDER — ENOXAPARIN SODIUM 40 MG/0.4ML ~~LOC~~ SOLN
40.0000 mg | SUBCUTANEOUS | Status: DC
  Filled 2014-02-23 (×2): qty 0.4

## 2014-02-23 MED ORDER — 0.9 % SODIUM CHLORIDE (POUR BTL) OPTIME
TOPICAL | Status: DC | PRN
  Administered 2014-02-23: 1000 mL

## 2014-02-23 MED ORDER — FENTANYL CITRATE 0.05 MG/ML IJ SOLN
INTRAMUSCULAR | Status: DC | PRN
  Administered 2014-02-23: 50 ug via INTRAVENOUS

## 2014-02-23 MED ORDER — FENTANYL CITRATE 0.05 MG/ML IJ SOLN
INTRAMUSCULAR | Status: AC
  Filled 2014-02-23: qty 2

## 2014-02-23 MED ORDER — FENTANYL CITRATE 0.05 MG/ML IJ SOLN
INTRAMUSCULAR | Status: AC
  Filled 2014-02-23: qty 5

## 2014-02-23 MED ORDER — BUPIVACAINE HCL (PF) 0.25 % IJ SOLN
INTRAMUSCULAR | Status: AC
  Filled 2014-02-23: qty 30

## 2014-02-23 MED ORDER — PROPOFOL 10 MG/ML IV BOLUS
INTRAVENOUS | Status: AC
  Filled 2014-02-23: qty 20

## 2014-02-23 MED ORDER — OXYCODONE HCL 5 MG PO TABS: 5.0000 mg | ORAL_TABLET | Freq: Once | ORAL | Status: DC | PRN

## 2014-02-23 MED ORDER — FENTANYL CITRATE 0.05 MG/ML IJ SOLN: 25.0000 ug | INTRAMUSCULAR | Status: DC | PRN

## 2014-02-23 MED ORDER — LACTATED RINGERS IV SOLN: INTRAVENOUS | Status: DC

## 2014-02-23 MED ORDER — MORPHINE SULFATE 2 MG/ML IJ SOLN: 1.0000 mg | INTRAMUSCULAR | Status: DC | PRN

## 2014-02-23 MED ORDER — OXYCODONE-ACETAMINOPHEN 5-325 MG PO TABS
1.0000 | ORAL_TABLET | ORAL | Status: DC | PRN
  Administered 2014-02-23 – 2014-02-24 (×3): 2 via ORAL
  Filled 2014-02-23 (×3): qty 2

## 2014-02-23 MED ORDER — PHENYLEPHRINE 40 MCG/ML (10ML) SYRINGE FOR IV PUSH (FOR BLOOD PRESSURE SUPPORT)
PREFILLED_SYRINGE | INTRAVENOUS | Status: AC
  Filled 2014-02-23: qty 10

## 2014-02-23 MED ORDER — ACETAMINOPHEN 160 MG/5ML PO SOLN
325.0000 mg | ORAL | Status: DC | PRN
  Filled 2014-02-23: qty 20.3

## 2014-02-23 MED ORDER — PHENYLEPHRINE HCL 10 MG/ML IJ SOLN
INTRAMUSCULAR | Status: DC | PRN
  Administered 2014-02-23: 40 ug via INTRAVENOUS
  Administered 2014-02-23 (×2): 80 ug via INTRAVENOUS

## 2014-02-23 MED ORDER — ROPIVACAINE HCL 5 MG/ML IJ SOLN
INTRAMUSCULAR | Status: DC | PRN
  Administered 2014-02-23: 25 mL via PERINEURAL
  Administered 2014-02-23: 15 mL via PERINEURAL

## 2014-02-23 MED ORDER — OXYCODONE-ACETAMINOPHEN 5-325 MG PO TABS: 1.0000 | ORAL_TABLET | ORAL | Status: AC | PRN

## 2014-02-23 MED ORDER — ONDANSETRON HCL 4 MG/2ML IJ SOLN: 4.0000 mg | Freq: Four times a day (QID) | INTRAMUSCULAR | Status: DC | PRN

## 2014-02-23 MED ORDER — MEPERIDINE HCL 25 MG/ML IJ SOLN: 6.2500 mg | INTRAMUSCULAR | Status: DC | PRN

## 2014-02-23 MED ORDER — HYDROMORPHONE HCL 1 MG/ML IJ SOLN: 0.2500 mg | INTRAMUSCULAR | Status: DC | PRN

## 2014-02-23 MED ORDER — OXYCODONE HCL 5 MG/5ML PO SOLN: 5.0000 mg | Freq: Once | ORAL | Status: DC | PRN

## 2014-02-23 SURGICAL SUPPLY — 71 items
BANDAGE ESMARK 6X9 LF (GAUZE/BANDAGES/DRESSINGS) ×1 IMPLANT
BIT DRILL 2.5X2.75 QC CALB (BIT) ×3 IMPLANT
BIT DRILL CALIBRATED 2.7 (BIT) ×2 IMPLANT
BIT DRILL CALIBRATED 2.7MM (BIT) ×1
BLADE SURG 10 STRL SS (BLADE) ×3 IMPLANT
BNDG COHESIVE 4X5 TAN STRL (GAUZE/BANDAGES/DRESSINGS) ×3 IMPLANT
BNDG COHESIVE 6X5 TAN STRL LF (GAUZE/BANDAGES/DRESSINGS) ×3 IMPLANT
BNDG ESMARK 6X9 LF (GAUZE/BANDAGES/DRESSINGS) ×3
CANISTER SUCT 3000ML (MISCELLANEOUS) ×3 IMPLANT
CHLORAPREP W/TINT 26ML (MISCELLANEOUS) ×3 IMPLANT
COVER SURGICAL LIGHT HANDLE (MISCELLANEOUS) ×3 IMPLANT
CUFF TOURNIQUET SINGLE 34IN LL (TOURNIQUET CUFF) ×3 IMPLANT
CUFF TOURNIQUET SINGLE 44IN (TOURNIQUET CUFF) IMPLANT
DRAPE C-ARM 42X72 X-RAY (DRAPES) ×3 IMPLANT
DRAPE C-ARMOR (DRAPES) ×3 IMPLANT
DRAPE U-SHAPE 47X51 STRL (DRAPES) ×3 IMPLANT
DRSG ADAPTIC 3X8 NADH LF (GAUZE/BANDAGES/DRESSINGS) IMPLANT
DRSG MEPITEL 4X7.2 (GAUZE/BANDAGES/DRESSINGS) ×3 IMPLANT
DRSG PAD ABDOMINAL 8X10 ST (GAUZE/BANDAGES/DRESSINGS) ×6 IMPLANT
ELECT REM PT RETURN 9FT ADLT (ELECTROSURGICAL) ×3
ELECTRODE REM PT RTRN 9FT ADLT (ELECTROSURGICAL) ×1 IMPLANT
GAUZE SPONGE 4X4 12PLY STRL (GAUZE/BANDAGES/DRESSINGS) ×3 IMPLANT
GLOVE BIO SURGEON STRL SZ7 (GLOVE) ×3 IMPLANT
GLOVE BIO SURGEON STRL SZ8 (GLOVE) ×3 IMPLANT
GLOVE BIOGEL PI IND STRL 7.5 (GLOVE) IMPLANT
GLOVE BIOGEL PI IND STRL 8 (GLOVE) ×1 IMPLANT
GLOVE BIOGEL PI INDICATOR 7.5 (GLOVE)
GLOVE BIOGEL PI INDICATOR 8 (GLOVE) ×2
GOWN STRL REUS W/ TWL LRG LVL3 (GOWN DISPOSABLE) ×1 IMPLANT
GOWN STRL REUS W/ TWL XL LVL3 (GOWN DISPOSABLE) ×1 IMPLANT
GOWN STRL REUS W/TWL LRG LVL3 (GOWN DISPOSABLE) ×2
GOWN STRL REUS W/TWL XL LVL3 (GOWN DISPOSABLE) ×2
K-WIRE ACE 1.6X6 (WIRE) ×6
KIT BASIN OR (CUSTOM PROCEDURE TRAY) ×3 IMPLANT
KIT ROOM TURNOVER OR (KITS) ×3 IMPLANT
KWIRE ACE 1.6X6 (WIRE) ×2 IMPLANT
NEEDLE 22X1 1/2 (OR ONLY) (NEEDLE) IMPLANT
NS IRRIG 1000ML POUR BTL (IV SOLUTION) ×3 IMPLANT
PACK ORTHO EXTREMITY (CUSTOM PROCEDURE TRAY) ×3 IMPLANT
PAD ARMBOARD 7.5X6 YLW CONV (MISCELLANEOUS) ×6 IMPLANT
PAD CAST 4YDX4 CTTN HI CHSV (CAST SUPPLIES) ×1 IMPLANT
PADDING CAST COTTON 4X4 STRL (CAST SUPPLIES) ×2
PADDING CAST COTTON 6X4 STRL (CAST SUPPLIES) ×3 IMPLANT
PLATE LOCK COMP 9H (Plate) ×3 IMPLANT
SCREW CANC LAG 4X50 (Screw) ×3 IMPLANT
SCREW CANC LAG 4X55 (Screw) ×3 IMPLANT
SCREW CORT 3.5X30 815037030 (Screw) ×3 IMPLANT
SCREW CORT 3.5X32 815037032 (Screw) ×3 IMPLANT
SCREW LOCK CORT STAR 3.5X24 (Screw) ×3 IMPLANT
SCREW LOCK CORT STAR 3.5X28 (Screw) ×3 IMPLANT
SCREW LOCK CORT STAR 3.5X34 (Screw) ×3 IMPLANT
SCREW LOCK CORT STAR 3.5X44 (Screw) ×6 IMPLANT
SCREW LOCK CORT STAR 3.5X46 (Screw) ×3 IMPLANT
SOAP 2 % CHG 4 OZ (WOUND CARE) IMPLANT
STAPLER VISISTAT 35W (STAPLE) IMPLANT
SUCTION FRAZIER TIP 10 FR DISP (SUCTIONS) ×3 IMPLANT
SUT ETHILON 3 0 PS 1 (SUTURE) ×3 IMPLANT
SUT MNCRL AB 3-0 PS2 18 (SUTURE) ×6 IMPLANT
SUT PROLENE 3 0 PS 2 (SUTURE) IMPLANT
SUT VIC AB 0 CT1 27 (SUTURE) ×2
SUT VIC AB 0 CT1 27XBRD ANBCTR (SUTURE) ×1 IMPLANT
SUT VIC AB 2-0 CT1 27 (SUTURE) ×2
SUT VIC AB 2-0 CT1 TAPERPNT 27 (SUTURE) ×1 IMPLANT
SUT VIC AB 3-0 PS2 18 (SUTURE)
SUT VIC AB 3-0 PS2 18XBRD (SUTURE) IMPLANT
SYR CONTROL 10ML LL (SYRINGE) IMPLANT
TAP CORT 3.5MM SOLID DISP (TRAUMA) ×3 IMPLANT
TOWEL OR 17X24 6PK STRL BLUE (TOWEL DISPOSABLE) ×3 IMPLANT
TOWEL OR 17X26 10 PK STRL BLUE (TOWEL DISPOSABLE) ×3 IMPLANT
TUBE CONNECTING 12'X1/4 (SUCTIONS) ×1
TUBE CONNECTING 12X1/4 (SUCTIONS) ×2 IMPLANT

## 2014-02-23 NOTE — Anesthesia Procedure Notes (Addendum)
Anesthesia Regional Block:  Adductor canal block  Pre-Anesthetic Checklist: ,, timeout performed, Correct Patient, Correct Site, Correct Laterality, Correct Procedure, Correct Position, site marked, Risks and benefits discussed,  Surgical consent,  Pre-op evaluation,  At surgeon's request and post-op pain management  Laterality: Lower and Right  Prep: chloraprep       Needles:  Injection technique: Single-shot  Needle Type: Echogenic Needle          Additional Needles:  Procedures: ultrasound guided (picture in chart) Adductor canal block Narrative:  Injection made incrementally with aspirations every 5 mL.  Performed by: Personally   Additional Notes: H+P and labs reviewed, risks and benefits discussed with patient, procedure tolerated well without complications   Anesthesia Regional Block:  Popliteal block  Pre-Anesthetic Checklist: ,, timeout performed, Correct Patient, Correct Site, Correct Laterality, Correct Procedure, Correct Position, site marked, Risks and benefits discussed,  Surgical consent,  Pre-op evaluation,  At surgeon's request and post-op pain management  Laterality: Lower and Right  Prep: chloraprep       Needles:  Injection technique: Single-shot  Needle Type: Echogenic Stimulator Needle          Additional Needles:  Procedures: ultrasound guided (picture in chart) and nerve stimulator Popliteal block  Nerve Stimulator or Paresthesia:  Response: plantarflexion, 0.48 mA,   Additional Responses:   Narrative:  Injection made incrementally with aspirations every 5 mL.  Performed by: Personally   Additional Notes: H+P and labs reviewed, risks and benefits discussed with patient, procedure tolerated well without complications   Procedure Name: LMA Insertion Date/Time: 02/23/2014 3:05 PM Performed by: Orvilla FusATO, Kayti Poss A Pre-anesthesia Checklist: Patient identified, Timeout performed, Emergency Drugs available, Suction available and  Patient being monitored Patient Re-evaluated:Patient Re-evaluated prior to inductionOxygen Delivery Method: Circle system utilized Preoxygenation: Pre-oxygenation with 100% oxygen Intubation Type: IV induction Ventilation: Mask ventilation without difficulty LMA: LMA inserted LMA Size: 4.0 Number of attempts: 1 Placement Confirmation: positive ETCO2 and breath sounds checked- equal and bilateral Tube secured with: Tape Dental Injury: Teeth and Oropharynx as per pre-operative assessment

## 2014-02-23 NOTE — Brief Op Note (Signed)
02/23/2014  5:13 PM  PATIENT:  Michael Harper  53 y.o. male  PRE-OPERATIVE DIAGNOSIS:  Closed, displaced Right tibial pilon fracture s/p closed reduction and external fixation  POST-OPERATIVE DIAGNOSIS:  same  Procedure(s): 1.  Removal of right lower extremity external fixator under anesthesia   2.  ORIF of right tibial pilon fracture  SURGEON:  Toni ArthursJohn Anaika Santillano, MD  ASSISTANT: n/a  ANESTHESIA:   General, regional  EBL:  minimal   TOURNIQUET:   Total Tourniquet Time Documented: Thigh (Right) - 85 minutes Total: Thigh (Right) - 85 minutes   COMPLICATIONS:  None apparent  DISPOSITION:  Extubated, awake and stable to recovery.  DICTATION ID:  [id number]

## 2014-02-23 NOTE — Discharge Instructions (Signed)
Michael ArthursJohn Jazae Gandolfi, MD [facility]  Please read the following information regarding your care after surgery.  Medications  You only need a prescription for the narcotic pain medicine (ex. oxycodone, Percocet, Norco).  All of the other medicines listed below are available over the counter. X Percocet as prescribed for severe pain X ibuprofen 800 mg every 8 hours as needed for mild to moderate pain.  Narcotic pain medicine (ex. oxycodone, Percocet, Vicodin) will cause constipation.  To prevent this problem, take the following medicines while you are taking any pain medicine. X docusate sodium (Colace) 100 mg twice a day X senna (Senokot) 2 tablets twice a day  X To help prevent blood clots, take an aspirin (325 mg) once a day for a month after surgery.  You should also get up every hour while you are awake to move around.    Weight Bearing ? Bear weight when you are able on your operated leg or foot. ? Bear weight only on the heel of your operated foot in the post-op shoe. X Do not bear any weight on the operated leg or foot.  Cast / Splint / Dressing X Keep your splint or cast clean and dry.  Dont put anything (coat hanger, pencil, etc) down inside of it.  If it gets damp, use a hair dryer on the cool setting to dry it.  If it gets soaked, call the office to schedule an appointment for a cast change. ? Remove your dressing 3 days after surgery and cover the incisions with dry dressings.    After your dressing, cast or splint is removed; you may shower, but do not soak or scrub the wound.  Allow the water to run over it, and then gently pat it dry.  Swelling It is normal for you to have swelling where you had surgery.  To reduce swelling and pain, keep your toes above your nose for at least 3 days after surgery.  It may be necessary to keep your foot or leg elevated for several weeks.  If it hurts, it should be elevated.  Follow Up Call my office at [phone] when you are  discharged from the hospital or surgery center to schedule an appointment to be seen two weeks after surgery.  Call my office at [phone] if you develop a fever >101.5 F, nausea, vomiting, bleeding from the surgical site or severe pain.

## 2014-02-23 NOTE — Anesthesia Postprocedure Evaluation (Signed)
  Anesthesia Post-op Note  Patient: Michael PionJose Harper  Procedure(s) Performed: Procedure(s): REMOVAL EXTERNAL FIXATION RIGHT FOOT/OPEN REDUCTION INTERNAL FIXATION (ORIF) RIGHT TIBIAL FRACTURE (Right)  Patient Location: PACU  Anesthesia Type:General and Regional  Level of Consciousness: awake  Airway and Oxygen Therapy: Patient Spontanous Breathing  Post-op Pain: none  Post-op Assessment: Post-op Vital signs reviewed, Patient's Cardiovascular Status Stable, Respiratory Function Stable, Patent Airway, No signs of Nausea or vomiting and Pain level controlled  Post-op Vital Signs: Reviewed and stable  Last Vitals:  Filed Vitals:   02/23/14 1745  BP: 138/83  Pulse: 89  Temp: 36.1 C  Resp: 15    Complications: No apparent anesthesia complications

## 2014-02-23 NOTE — Anesthesia Preprocedure Evaluation (Addendum)
Anesthesia Evaluation  Patient identified by MRN, date of birth, ID band Patient awake    Reviewed: Allergy & Precautions, H&P , NPO status , Patient's Chart, lab work & pertinent test results, reviewed documented beta blocker date and time   History of Anesthesia Complications Negative for: history of anesthetic complications  Airway Mallampati: I  TM Distance: >3 FB Neck ROM: Full    Dental  (+) Teeth Intact,    Pulmonary neg pulmonary ROS,  breath sounds clear to auscultation        Cardiovascular negative cardio ROS  Rhythm:Regular  EKG 12/5 R atrial enlargemen totherwise normal   Neuro/Psych negative neurological ROS  negative psych ROS   GI/Hepatic negative GI ROS, Neg liver ROS,   Endo/Other  negative endocrine ROS  Renal/GU negative Renal ROS     Musculoskeletal Right ankle fracture   Abdominal   Peds  Hematology 13/40 H/H   Anesthesia Other Findings   Reproductive/Obstetrics                            Anesthesia Physical Anesthesia Plan  ASA: I  Anesthesia Plan: General and Regional   Post-op Pain Management:    Induction: Intravenous  Airway Management Planned: LMA  Additional Equipment: None  Intra-op Plan:   Post-operative Plan: Extubation in OR  Informed Consent: I have reviewed the patients History and Physical, chart, labs and discussed the procedure including the risks, benefits and alternatives for the proposed anesthesia with the patient or authorized representative who has indicated his/her understanding and acceptance.   Dental advisory given  Plan Discussed with: CRNA and Surgeon  Anesthesia Plan Comments:        Anesthesia Quick Evaluation

## 2014-02-23 NOTE — H&P (Signed)
Michael Harper is an 53 y.o. male.   Chief Complaint: right tibial pilon fracture HPI:  53 y/o male without significant PMH fell about two weeks ago sustaining a right tibial pilon fracture.  He underwent closed reduction and application of an external fixator the night of his injury.  He presents now for removal of the ex fix and ORIF.    PMH:  none  Past Surgical History  Procedure Laterality Date  . External fixation leg Right 02/04/2014    Procedure: CLOSED REDUCTION, EXTERNAL FIXATION LEG;  Surgeon: Toni ArthursJohn Aurie Harroun, MD;  Location: [facility] OR;  Service: Orthopedics;  Laterality: Right;    FH:  Neg for diabetes.  Social History:  reports that he has never smoked. He does not have any smokeless tobacco history on file. He reports that he does not drink alcohol or use illicit drugs.  Allergies: No Known Allergies  Medications Prior to Admission  Medication Sig Dispense Refill  . oxyCODONE-acetaminophen (PERCOCET/ROXICET) 5-325 MG per tablet Take 1-2 tablets by mouth every 4 (four) hours as needed for moderate pain or severe pain. 30 tablet 0    No results found for this or any previous visit (from the past 48 hour(s)). No results found.  ROS  No recent f/c/n/v/wt loss  Blood pressure 133/83, pulse 88, temperature 97.7 F (36.5 C), resp. rate 16, height 5\' 4"  (1.626 m), weight 58.968 kg (130 lb), SpO2 100 %. Physical Exam  wn wd male in nad.  A and Ox 4.  Mood and affect normal.  EOMI.  Resp unlabored.  R LE with delta frame ex fix in place.  No signs of infection.  Brisk cap refill at toes.  Sens to LT intact dorsally and plantarly at the foot.  Assessment/Plan Right tibial pilon fracture s/p closed reduction and external fixation.  To OR for removal of the ex fix and ORIF of the fracture.  The risks and benefits of the alternative treatment options have been discussed in detail.  The patient wishes to proceed with surgery and specifically understands risks of bleeding, infection, nerve  damage, blood clots, need for additional surgery, amputation and death.   Toni ArthursHEWITT, Michael Harper 02/23/2014, 2:46 PM

## 2014-02-23 NOTE — Transfer of Care (Signed)
Immediate Anesthesia Transfer of Care Note  Patient: Michael Harper  Procedure(s) Performed: Procedure(s): REMOVAL EXTERNAL FIXATION RIGHT FOOT/OPEN REDUCTION INTERNAL FIXATION (ORIF) RIGHT TIBIAL FRACTURE (Right)  Patient Location: PACU  Anesthesia Type:General  Level of Consciousness: awake, alert  and oriented  Airway & Oxygen Therapy: Patient connected to nasal cannula oxygen  Post-op Assessment: Report given to PACU RN  Post vital signs: stable  Complications: No apparent anesthesia complications

## 2014-02-24 ENCOUNTER — Encounter (HOSPITAL_COMMUNITY): Payer: Self-pay | Admitting: Orthopedic Surgery

## 2014-02-24 MED ORDER — ASPIRIN EC 325 MG PO TBEC: 325.0000 mg | DELAYED_RELEASE_TABLET | Freq: Every day | ORAL | Status: AC

## 2014-02-24 MED ORDER — MAGNESIUM CITRATE PO SOLN
1.0000 | Freq: Once | ORAL | Status: AC
  Administered 2014-02-24: 1 via ORAL
  Filled 2014-02-24: qty 296

## 2014-02-24 NOTE — Discharge Summary (Signed)
Physician Discharge Summary  Patient ID: Michael Harper MRN: [id number] DOB/AGE: February 20, 1961 53 y.o.  Admit date: 02/23/2014 Discharge date: 02/24/2014  Admission Diagnoses:  Right tibial pilon fracture s/p closed reduction and external fixation  Discharge Diagnoses:  Active Problems:   Pilon fracture of right tibia s/p removal of ex fix and ORIF.  Discharged Condition: stable  Hospital Course: Pt was admitted and underwent removal of ex fixa nd ORIF of his right tibial pilon fracture.  Post op he did well and is discharged to home instable condition.  Consults: None  Significant Diagnostic Studies: n9one  Treatments: surgery: as above  Discharge Exam: Blood pressure 109/74, pulse 83, temperature 97.5 F (36.4 C), temperature source Oral, resp. rate 17, height 5\' 4"  (1.626 m), weight 58.968 kg (130 lb), SpO2 99 %. wn wd male innad.  A and O.  R foot immobilized in SLS.  Wiggles toes.  Disposition: 01-Home or Self Care  Discharge Instructions    Call MD / Call 911    Complete by:  As directed   If you experience chest pain or shortness of breath, CALL 911 and be transported to the hospital emergency room.  If you develope a fever above 101 F, pus (white drainage) or increased drainage or redness at the wound, or calf pain, call your surgeon's office.     Constipation Prevention    Complete by:  As directed   Drink plenty of fluids.  Prune juice may be helpful.  You may use a stool softener, such as Colace (over the counter) 100 mg twice a day.  Use MiraLax (over the counter) for constipation as needed.     Diet - low sodium heart healthy    Complete by:  As directed      Increase activity slowly as tolerated    Complete by:  As directed      Non weight bearing    Complete by:  As directed   Laterality:  right  Extremity:  Lower            Medication List    TAKE these medications        oxyCODONE-acetaminophen 5-325 MG per tablet  Commonly known as:   PERCOCET/ROXICET  Take 1-2 tablets by mouth every 4 (four) hours as needed for moderate pain or severe pain.      aspirin 325 mg daily for 6 weeks.     Follow-up Information    Follow up with Toni ArthursHEWITT, Mauri Tolen, MD.   Specialty:  Orthopedic Surgery   Contact information:   [address] [place] [address] [phone]       Signed: Toni ArthursHEWITT, Chilton Sallade 02/24/2014, 6:51 AM

## 2014-02-24 NOTE — Progress Notes (Signed)
D/c instructions and scripts given to Pt. IV D/Cd and Mag. Citrate admin per MD order prior to discharge. Pt verbalized understanding of home care and family at the bedside to take Pt home at this time.

## 2014-02-24 NOTE — Op Note (Signed)
NAMBunnie Pion:  Stream, Manasseh                ACCOUNT NO.:  [id number]  MEDICAL RECORD NO.:  [id number]  LOCATION:  5N15C                        FACILITY:  [facility]  PHYSICIAN:  Toni ArthursJohn Danett Palazzo, MD        DATE OF BIRTH:  [birth year]  DATE OF PROCEDURE:  02/23/2014 DATE OF DISCHARGE:                              OPERATIVE REPORT   PREOPERATIVE DIAGNOSIS:  Closed displaced right tibial pilon fracture status post closed reduction and external fixation.  POSTOPERATIVE DIAGNOSIS:  Closed displaced right tibial pilon fracture status post closed reduction and external fixation.  PROCEDURE: 1. Removal of right lower extremity external fixator under anesthesia. 2. Open reduction and internal fixation of right tibial pilon     fracture.  SURGEON:  Toni ArthursJohn Elizabet Schweppe, MD  ANESTHESIA:  General, regional.  ESTIMATED BLOOD LOSS:  Minimal.  TOURNIQUET TIME:  85 minutes at 250 mmHg.  COMPLICATIONS:  None apparent.  DISPOSITION:  Extubated, awake, and stable to recovery.  INDICATIONS FOR PROCEDURE:  The patient is a 53 year old male without significant past medical history.  He fell on some steps about 2 weeks ago injuring his right lower extremity.  He was found at that time to have a displaced comminuted fracture of his tibial pilon.  He underwent closed reduction and application of an external fixator at that time. He presents now for a planned return to the operating room for definitive management of this injury.  Plan today is to remove the external fixator and perform open reduction and internal fixation of the fracture.  He understands the risks and benefits, the alternative treatment options, and elects surgical treatment.  He specifically understands the risks of bleeding, infection, nerve damage, blood clots, need for additional surgery, continued pain, amputation, and death.  PROCEDURE IN DETAIL:  After preoperative consent was obtained and the correct operative site was identified, the patient was  brought to the operating room and placed supine on the operating table.  General anesthesia was induced.  Preoperative antibiotics were administered. Surgical time-out was taken.  The left lower extremity external fixator was then identified.  All the components were loosened and removed. Schanz pins were removed from the tibia.  The calcaneal pin was prepared sterilely and removed in its entirety.  The right lower extremity was then prepped and draped in standard sterile fashion with tourniquet around the thigh.  The extremity was exsanguinated, and the tourniquet was inflated to 250 mmHg.  A longitudinal incision was made over the anterior aspect of the tibia.  Sharp dissection was carried down through the skin and subcutaneous tissue.  The extensor retinaculum was incised over the extensor hallucis longus.  The interval between the tibialis anterior and extensor hallucis longus was then developed taking care to protect the neurovascular bundle.  The neurovascular bundle was mobilized and retracted laterally and protected throughout the case. The fracture site was identified.  Fracture fragments were opened and irrigated copiously.  The fracture was reduced and provisionally pinned and held with a tenaculum.  AP and lateral radiographs confirmed appropriate reduction of the fracture.  An anterolateral narrow distal tibial plate was selected from the [employer] ALPS set.  It was provisionally pinned.  AP and  lateral radiographs confirmed appropriate position of the plate.  It was then fixed distally to the tibia with a solid 4-mm partially threaded screw.  This compressed the fracture site appropriately.  The remaining holes distally were then drilled and filled with locking screws and the nonlocking screw was removed and replaced with a locking screw.  At this point, the proximal limb of the plate was provisionally held with a bone with a fixation pin.  AP and lateral radiographs confirmed  appropriate reduction of the fracture and appropriate position and length of the hardware.  A stab incision was made over the anterolateral tibia at the site of the proximal end of the plate.  The most proximal hole was drilled and filled with a nonlocking 3.5-mm fully-threaded screw.  This pulled the plate securely down the bone.  The third most proximal hole was drilled and filled with a 3.5-mm fully-threaded nonlocking screw.  The hole between these two was then drilled and filled with a locking 3.5-mm fully-threaded screw.  AP, mortise, and lateral radiographs of the ankle showed appropriate reduction of the fracture and appropriate position and length of all hardware.  Wounds were irrigated copiously.  The anterior joint capsule was repaired over the plate with simple sutures of 0 Vicryl.  The extensor retinaculum was then repaired with simple sutures of 0 Vicryl. Subcutaneous tissue was reapproximated with inverted simple sutures of 3- 0 Monocryl, and a running 3-0 nylon was used to close the skin incision. The stab incisions were closed with Monocryl and nylon as well.  Sterile dressings were applied, followed by a well-padded short-leg splint. Tourniquet was released at 85 minutes after application of the dressings.  The patient was awakened from anesthesia and transported to the recovery room in stable condition.  FOLLOWUP PLAN:  The patient will be observed overnight for pain control. He will be discharged home in the morning nonweightbearing on the right lower extremity.  He will follow up with me in the office in 2 weeks for suture removal and conversion to a cast.     Toni ArthursJohn Jenicka Coxe, MD     JH/MEDQ  D:  02/23/2014  T:  02/24/2014  Job:  [id number]

## 2014-02-24 NOTE — Progress Notes (Signed)
UR completed 

## 2015-08-03 IMAGING — RF DG ANKLE COMPLETE 3+V*R*
1 series · 4 of 4 positions shown · non-contrast
Comparison: 02/04/2014

FLUOROSCOPY TIME:  20 seconds

CLINICAL DATA: Distal tibial fracture, ORIF

EXAM:
DG C-ARM 61-120 MIN; RIGHT ANKLE - COMPLETE 3+ VIEW

[Series 1: run · 4 of 4 slices shown]
[im 1/4]
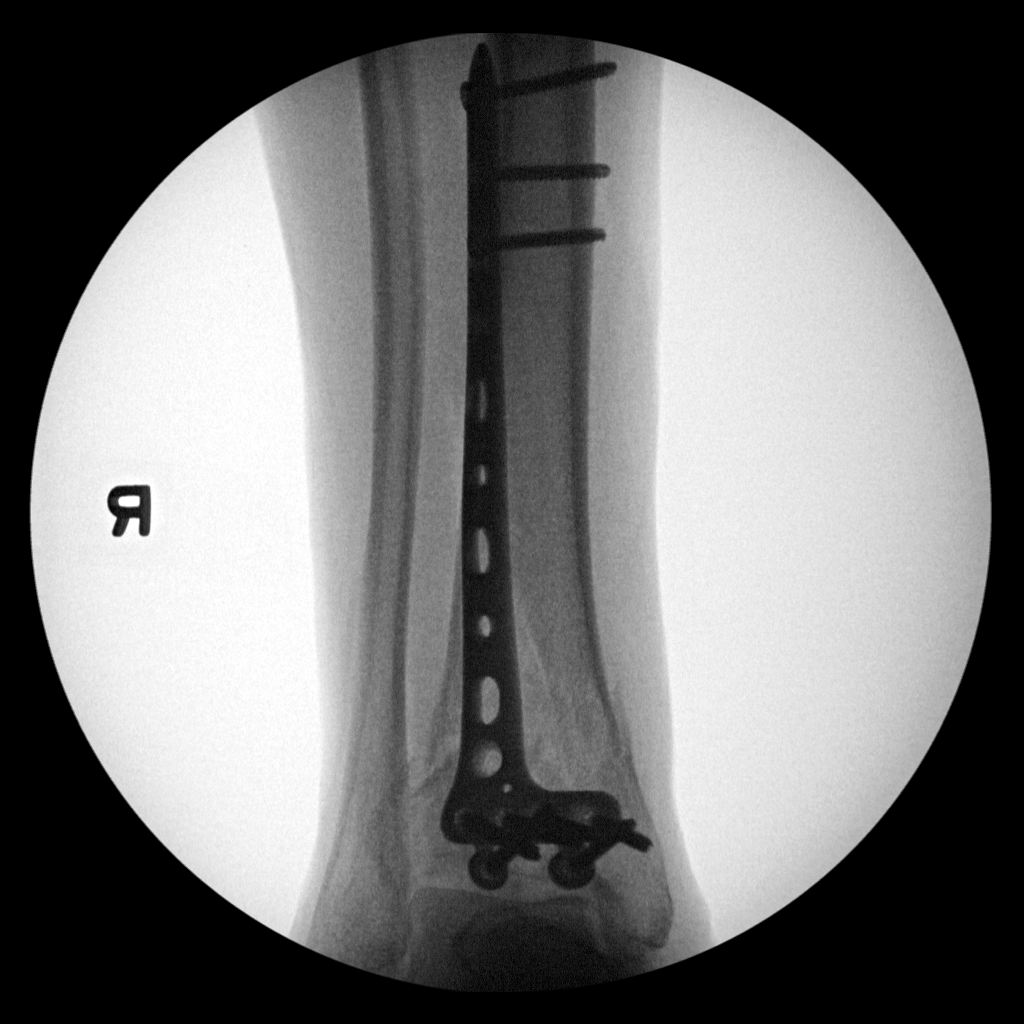
[im 2/4]
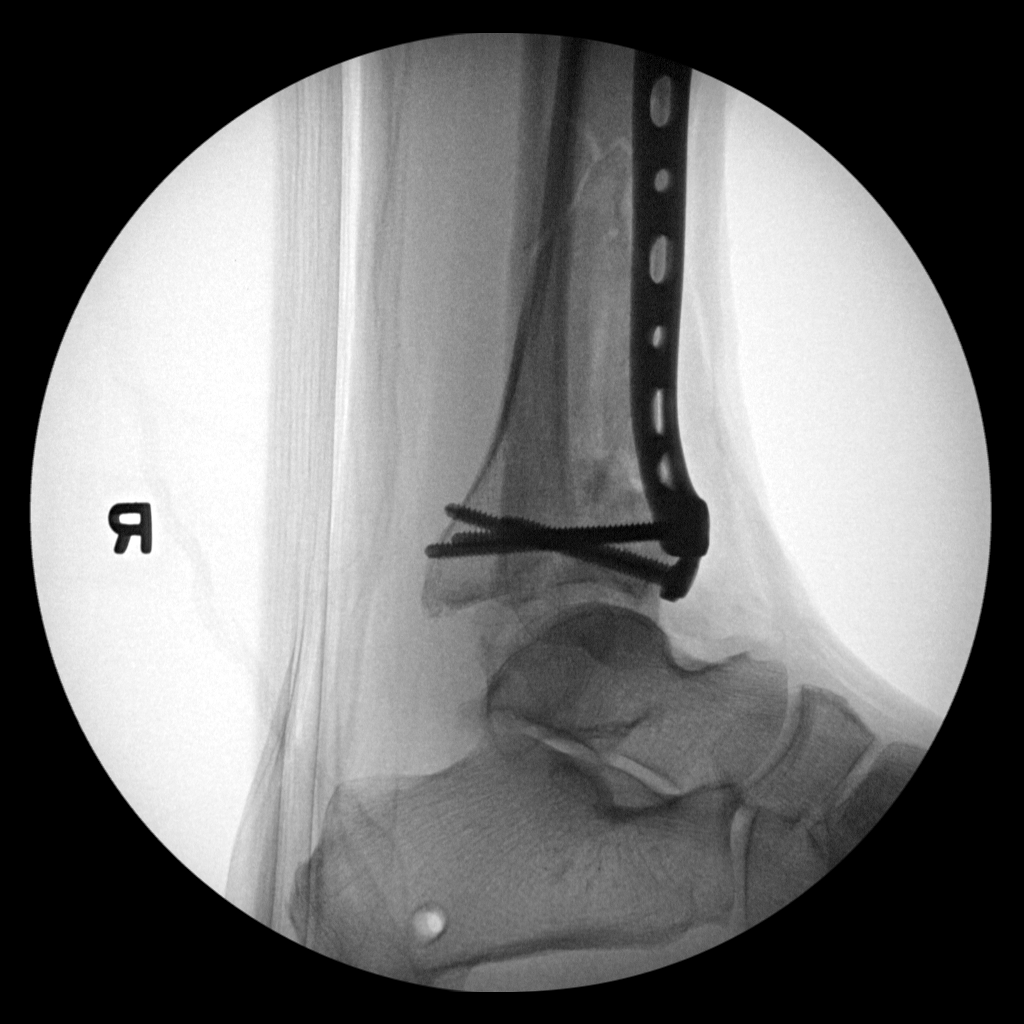
[im 3/4]
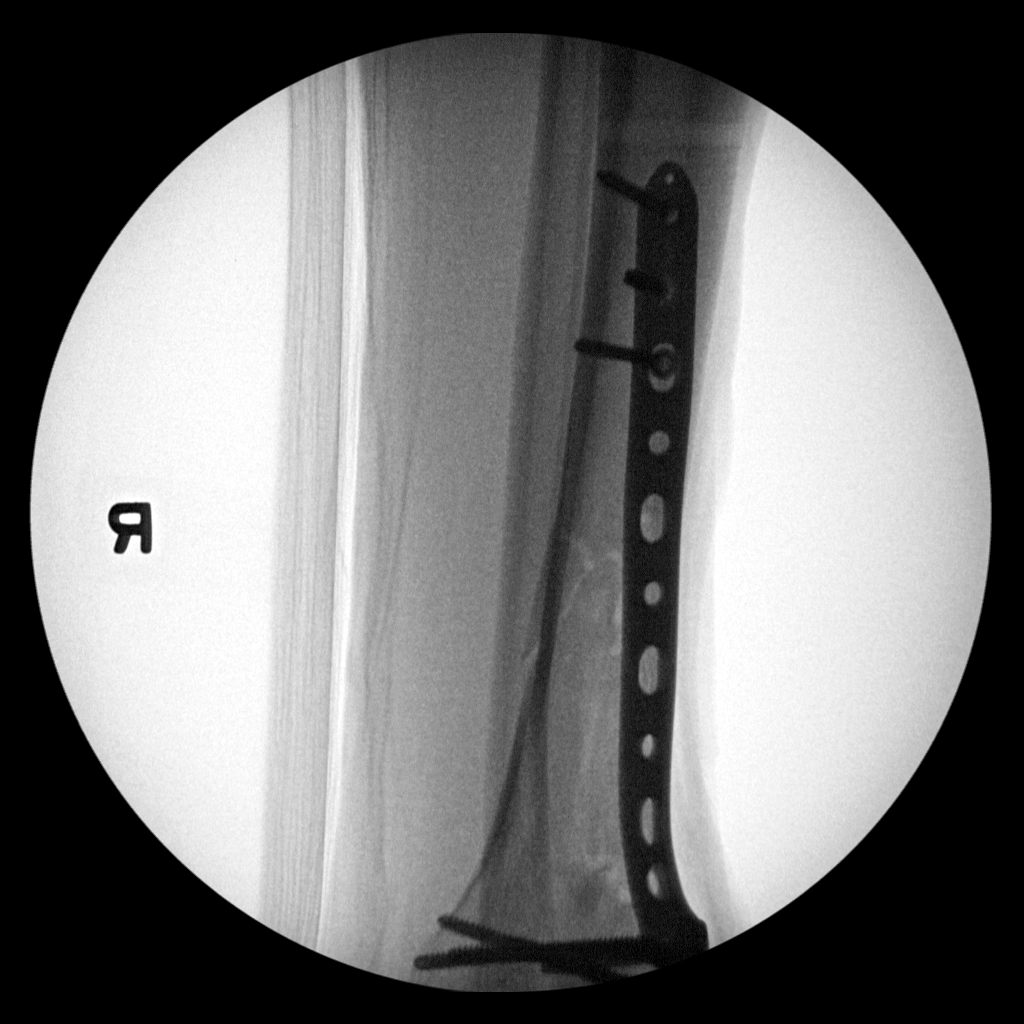
[im 4/4]
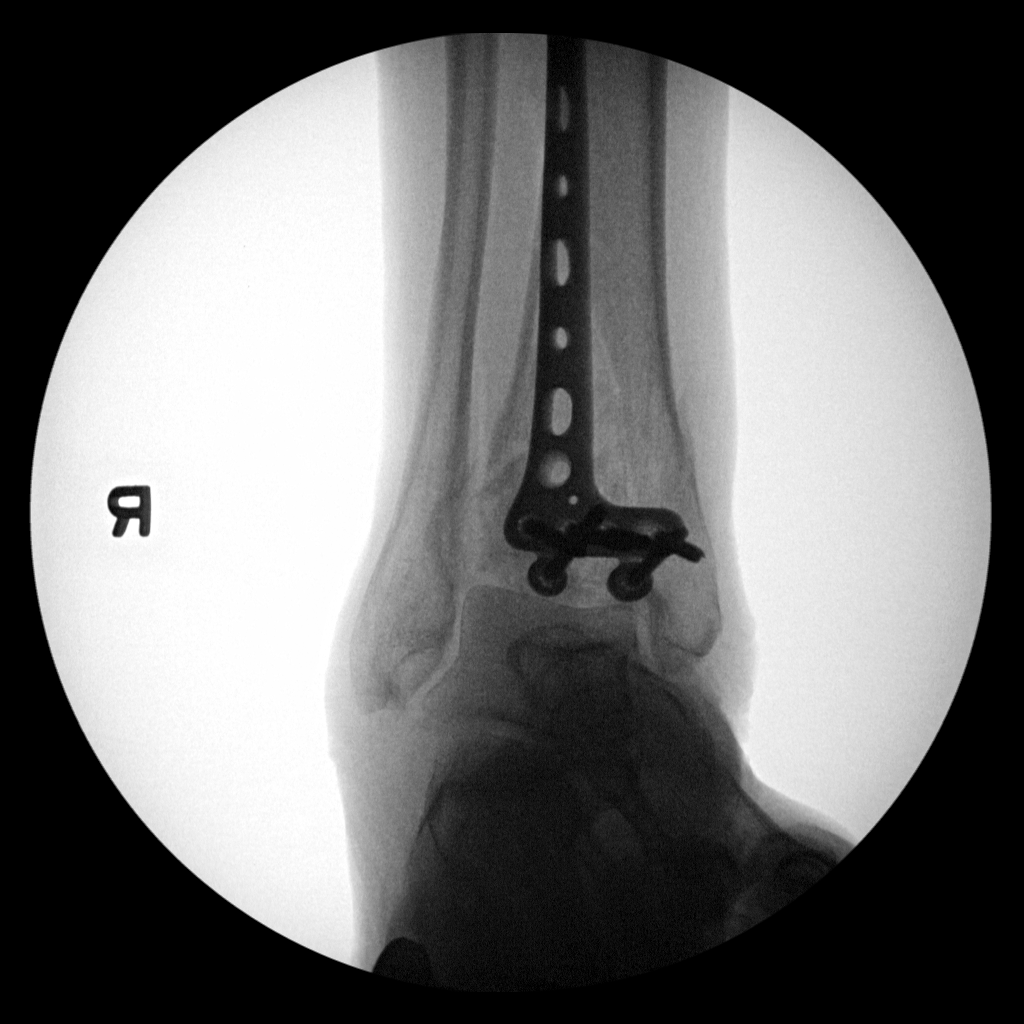

[4 of 4 positions shown; findings below may reference images not displayed]

FINDINGS: The fixation sideplate is noted along the distal tibia anteriorly.
Multiple fixation screws are seen. Defect is noted in the calcaneus
from the prior external fixator.
IMPRESSION: Open reduction internal fixation of a distal tibial fracture.

## 2021-01-12 ENCOUNTER — Emergency Department (HOSPITAL_COMMUNITY)
Admission: EM | Admit: 2021-01-12 | Discharge: 2021-01-16 | Disposition: A | Discharge status: Other Acute Inpatient Hospital | Payer: Self-pay | Attending: Emergency Medicine | Admitting: Emergency Medicine

## 2021-01-12 ENCOUNTER — Emergency Department (HOSPITAL_COMMUNITY): Payer: Self-pay

## 2021-01-12 ENCOUNTER — Ambulatory Visit (HOSPITAL_COMMUNITY)
Admission: EM | Admit: 2021-01-12 | Discharge: 2021-01-12 | Disposition: A | Discharge status: Other Acute Inpatient Hospital | Payer: No Payment, Other | Attending: Psychiatry | Admitting: Psychiatry

## 2021-01-12 ENCOUNTER — Encounter (HOSPITAL_COMMUNITY): Payer: Self-pay | Admitting: Emergency Medicine

## 2021-01-12 DIAGNOSIS — Z79899 Other long term (current) drug therapy: Secondary | ICD-10-CM | POA: Insufficient documentation

## 2021-01-12 DIAGNOSIS — Z7982 Long term (current) use of aspirin: Secondary | ICD-10-CM | POA: Insufficient documentation

## 2021-01-12 DIAGNOSIS — Z20822 Contact with and (suspected) exposure to covid-19: Secondary | ICD-10-CM | POA: Insufficient documentation

## 2021-01-12 DIAGNOSIS — F29 Unspecified psychosis not due to a substance or known physiological condition: Secondary | ICD-10-CM | POA: Insufficient documentation

## 2021-01-12 LAB — CBC WITH DIFFERENTIAL/PLATELET
Abs Immature Granulocytes: 0.01 10*3/uL (ref 0.00–0.07)
Basophils Absolute: 0 10*3/uL (ref 0.0–0.1)
Basophils Relative: 1 %
Eosinophils Absolute: 0.1 10*3/uL (ref 0.0–0.5)
Eosinophils Relative: 1 %
HCT: 48.5 % (ref 39.0–52.0)
Hemoglobin: 15.8 g/dL (ref 13.0–17.0)
Immature Granulocytes: 0 %
Lymphocytes Relative: 29 %
Lymphs Abs: 2.1 10*3/uL (ref 0.7–4.0)
MCH: 29.4 pg (ref 26.0–34.0)
MCHC: 32.6 g/dL (ref 30.0–36.0)
MCV: 90.3 fL (ref 80.0–100.0)
Monocytes Absolute: 0.4 10*3/uL (ref 0.1–1.0)
Monocytes Relative: 6 %
Neutro Abs: 4.5 10*3/uL (ref 1.7–7.7)
Neutrophils Relative %: 63 %
Platelets: 255 10*3/uL (ref 150–400)
RBC: 5.37 MIL/uL (ref 4.22–5.81)
RDW: 12.4 % (ref 11.5–15.5)
WBC: 7.1 10*3/uL (ref 4.0–10.5)
nRBC: 0 % (ref 0.0–0.2)

## 2021-01-12 LAB — COMPREHENSIVE METABOLIC PANEL
ALT: 35 U/L (ref 0–44)
AST: 27 U/L (ref 15–41)
Albumin: 4 g/dL (ref 3.5–5.0)
Alkaline Phosphatase: 104 U/L (ref 38–126)
Anion gap: 6 (ref 5–15)
BUN: 12 mg/dL (ref 8–23)
CO2: 30 mmol/L (ref 22–32)
Calcium: 9.1 mg/dL (ref 8.9–10.3)
Chloride: 102 mmol/L (ref 98–111)
Creatinine, Ser: 0.77 mg/dL (ref 0.61–1.24)
GFR, Estimated: >60 mL/min (ref >60)
Glucose, Bld: 125 mg/dL — ABNORMAL HIGH (ref 70–99)
Potassium: 3.7 mmol/L (ref 3.5–5.1)
Sodium: 138 mmol/L (ref 135–145)
Total Bilirubin: 0.7 mg/dL (ref 0.3–1.2)
Total Protein: 7 g/dL (ref 6.5–8.1)

## 2021-01-12 LAB — ETHANOL: Alcohol, Ethyl (B): <10 mg/dL (ref <10)

## 2021-01-12 NOTE — ED Provider Notes (Signed)
[facility] EMERGENCY DEPARTMENT Provider Note   CSN: [id number] Arrival date & time: 01/12/21  1252     History Chief Complaint  Patient presents with   Psychiatric Evaluation    Michael Harper is a 60 y.o. male.  HPI  Presents with altered mental status/psychosis.  Level 5 caveat applies.  Patient is having bouts of grandiosity and stating that he has limited time to save the world.  He is oriented to self and place, not oriented to date or time.  According to his niece who does not live with them he has never acted like this in the past.  He does state he is use illicit drugs in the last couple months, unclear which drugs those are.  Currently denies any drug use in the last 2 months.  No history of mental illness.   Patient was seen earlier at behavioral health, sent to ED for CT head to evaluate for possible causes of worsening psychosis.  History reviewed. No pertinent past medical history.  Patient Active Problem List   Diagnosis Date Noted   Pilon fracture of right tibia 02/04/2014    Past Surgical History:  Procedure Laterality Date   EXTERNAL FIXATION LEG Right 02/04/2014   Procedure: CLOSED REDUCTION, EXTERNAL FIXATION LEG;  Surgeon: Toni Arthurs, MD;  Location: [facility] OR;  Service: Orthopedics;  Laterality: Right;   ORIF TIBIA FRACTURE Right 02/23/2014   Procedure: REMOVAL EXTERNAL FIXATION RIGHT FOOT/OPEN REDUCTION INTERNAL FIXATION (ORIF) RIGHT TIBIAL FRACTURE;  Surgeon: Toni Arthurs, MD;  Location: [facility] OR;  Service: Orthopedics;  Laterality: Right;       History reviewed. No pertinent family history.  Social History   Tobacco Use   Smoking status: Never  Substance Use Topics   Alcohol use: No    Alcohol/week: 0.0 standard drinks   Drug use: No    Home Medications Prior to Admission medications   Medication Sig Start Date End Date Taking? Authorizing Provider  aspirin EC 325 MG tablet Take 1 tablet (325 mg total) by mouth daily. 02/24/14    Toni Arthurs, MD  oxyCODONE-acetaminophen (PERCOCET/ROXICET) 5-325 MG per tablet Take 1-2 tablets by mouth every 4 (four) hours as needed for moderate pain or severe pain. 02/23/14   Toni Arthurs, MD    Allergies    Patient has no known allergies.  Review of Systems   Review of Systems  Unable to perform ROS: Psychiatric disorder   Physical Exam Updated Vital Signs BP (!) 172/97 (BP Location: Right Arm)   Pulse 87   Temp 98 F (36.7 C)   Resp 18   SpO2 97%   Physical Exam Vitals and nursing note reviewed. Exam conducted with a chaperone present.  Constitutional:      Appearance: Normal appearance.  HENT:     Head: Normocephalic and atraumatic.  Eyes:     General: No scleral icterus.       Right eye: No discharge.        Left eye: No discharge.     Extraocular Movements: Extraocular movements intact.     Pupils: Pupils are equal, round, and reactive to light.  Cardiovascular:     Rate and Rhythm: Normal rate and regular rhythm.     Pulses: Normal pulses.     Heart sounds: Normal heart sounds. No murmur heard.   No friction rub. No gallop.  Pulmonary:     Effort: Pulmonary effort is normal. No respiratory distress.     Breath sounds: Normal breath sounds.  Abdominal:     General: Abdomen is flat. Bowel sounds are normal. There is no distension.     Palpations: Abdomen is soft.     Tenderness: There is no abdominal tenderness.  Skin:    General: Skin is warm and dry.     Coloration: Skin is not jaundiced.  Neurological:     Mental Status: He is alert. He is disoriented.     Coordination: Coordination normal.     Comments: Oriented to self and place, not to month or year  Psychiatric:        Mood and Affect: Mood is elated.        Speech: Speech is rapid and pressured and tangential.        Behavior: Behavior is cooperative.        Thought Content: Thought content is paranoid.        Judgment: Judgment is impulsive.   ED Results / Procedures / Treatments    Labs (all labs ordered are listed, but only abnormal results are displayed) Labs Reviewed  RESP PANEL BY RT-PCR (FLU A&B, COVID) ARPGX2  COMPREHENSIVE METABOLIC PANEL  ETHANOL  RAPID URINE DRUG SCREEN, HOSP PERFORMED  CBC WITH DIFFERENTIAL/PLATELET    EKG None  Radiology No results found.  Procedures Procedures   Medications Ordered in ED Medications - No data to display  ED Course  I have reviewed the triage vital signs and the nursing notes.  Pertinent labs & imaging results that were available during my care of the patient were reviewed by me and considered in my medical decision making (see chart for details).    MDM Rules/Calculators/A&P                           Patient vitals are stable, he is having grandiose thoughts.  Will need psychiatric evaluation once medically cleared.  Patient under IVC due to concerns that he will leave prior to evaluation in his current mental state.  Patient is medically cleared and appropriate for psych evaluation at this time.  Final Clinical Impression(s) / ED Diagnoses Final diagnoses:  None    Rx / DC Orders ED Discharge Orders     None        Sherrill Raring, PA-C 01/12/21 [place], [place], DO 01/16/21 [phone]

## 2021-01-12 NOTE — Progress Notes (Signed)
   01/12/21 0945  [facility] Triage Screening (Walk-ins at [facility] only)  How Did You Hear About [place]? Family/Friend  What Is the Reason for Your Visit/Call Today? Delusions and Hallucinatons  How Long Has This Been Causing You Problems? 1 wk - 1 month  Have You Recently Had Any Thoughts About Hurting Yourself? Yes  How long ago did you have thoughts about hurting yourself? week  Are You Planning to Commit Suicide/Harm Yourself At This time? No  Have you Recently Had Thoughts About Hurting Someone Karolee Ohs? No  Are You Planning To Harm Someone At This Time? No  Are you currently experiencing any auditory, visual or other hallucinations? Yes  Please explain the hallucinations you are currently experiencing: Pt thinks that he is God, and higher power, "I feel like a dog and a person is coming out of the dog'  Have You Used Any Alcohol or Drugs in the Past 24 Hours? No  Do you have any current medical co-morbidities that require immediate attention? No  Clinician description of patient physical appearance/behavior: cooperative  What Do You Feel Would Help You the Most Today? Treatment for Depression or other mood problem  If access to [facility] was not available, would you have sought care in the Emergency Department? Yes  Determination of Need Urgent (48 hours)

## 2021-01-12 NOTE — ED Provider Notes (Signed)
Emergency Medicine Provider Triage Evaluation Note  Michael Harper , a 60 y.o. male  was evaluated in triage.  Pt complains of altered mental status.  Level 5 caveat applies.  Patient was seen at behavioral health urgent care brought in by his niece.  Saying he has "a lot to do to save people".  He is singing and humming but does respond to questions.    Review of Systems  Positive: Psychosis, disoriented Negative: SI  Physical Exam  BP (!) 172/97 (BP Location: Right Arm)   Pulse 87   Temp 98 F (36.7 C)   Resp 18   SpO2 97%  Gen:   Awake, no distress pleasantly psychotic Resp:  Normal effort  MSK:   Moves extremities without difficulty  Other:  Oriented to self and location but not to time or year.  Medical Decision Making  Medically screening exam initiated at 1:39 PM.  Appropriate orders placed.  Michael Harper was informed that the remainder of the evaluation will be completed by another provider, this initial triage assessment does not replace that evaluation, and the importance of remaining in the ED until their evaluation is complete.  CT head, medical screening labs.   Theron Arista, PA-C 01/12/21 1340    [place], Adam, DO 01/12/21 1344

## 2021-01-12 NOTE — BH Assessment (Signed)
Michael Harper, Urgent, MR #[id number]; 60 years old presents this date with his niece, Aquilla Solian, [phone].  Pt denies SI, or HI.  Pt reports hearing voices from god; also, thinks that he has a higher power.  Pt reports that a dog and a person is after him, "the person is coming out of the dog'.  Pt denies any prior MH diagnosis or prescribed medication for symptom management.  MSE signed by patient.

## 2021-01-12 NOTE — ED Notes (Signed)
Report called to Chestine Spore, charge RN @[facility] 

## 2021-01-12 NOTE — ED Provider Notes (Signed)
Behavioral Health Urgent Care Medical Screening Exam  Patient Name: Michael Harper MRN: [id number] Date of Evaluation: 01/12/21 Chief Complaint:   Diagnosis:  Final diagnoses:  Psychosis, unspecified psychosis type (HCC)    History of Present illness: Michael Harper is a 60 y.o. male. patient presented to [facility] as a walk in accompanied by  his niece with complaints of "I have a lot to do to save people".  Michael Harper, 60 y.o., male patient seen face to face by this provider, consulted with Dr. Bronwen Betters; and chart reviewed on 01/12/21.  On evaluation Michael Harper's niece report that "things that he's saying is not reality and he feels like he has a higher purpose". Patient believes he has a super power, stating "I can destroy them". Patient elaborated by stating "I work and no pay". Patient noted with delusions of grandeur, tangential speech, labile mood with intermittent tearful affect, and fidgeting in his seat at times. Patient without a history of psychosis and reports no medication regimen. During evaluation Michael Harper is sitting in no acute distress.  He is alert/oriented to person and place; anxious but cooperative; and labile mood.  His thought process is disorganized. Patient is noted randomly referring to a dog and motioning his hands towards his jaw as though he is being bitten. Patient noted making a growling noise while making the motion. Patient reports that he hears Jesus' voice and is able to see Jesus. Michael Harper [place]blesses the cars as they drive beneath". Patient denies suicidal and homicidal ideations. Per his niece, patient does report symptoms of paranoia, stating that "people are watching and knows" what he is doing.  At this time Michael Harper is in agreement with completing a CT due to new onset of psychosis. Accepting provider, Dr. Wilkie Aye, provided with report and patient will be transported to [facility] ED to complete CT,  prior to further treatment options. Michael Harper agreed to return to [facility] following medical clearance.  Psychiatric Specialty Exam  Presentation  General Appearance:Appropriate for Environment  Eye Contact:Fleeting  Speech:Clear and Coherent  Speech Volume:Normal  Handedness:Right   Mood and Affect  Mood:Anxious; Depressed  Affect:Congruent   Thought Process  Thought Processes:Disorganized  Descriptions of Associations:Circumstantial  Orientation:Full (Time, Place and Person)  Thought Content:Delusions; Paranoid Ideation; Perseveration; Illogical  Diagnosis of Schizophrenia or Schizoaffective disorder in past: No  Duration of Psychotic Symptoms: Less than six months  Hallucinations:Auditory; Visual patient reports that he hears Jesus' voice patient reports seeing Jesus  Ideas of Reference:Delusions; Paranoia  Suicidal Thoughts:No  Homicidal Thoughts:No   Sensorium  Memory:Immediate Fair  Judgment:Poor  Insight:Poor   Executive Functions  Concentration:Fair  Attention Span:Fair  Recall:Fair  Fund of Knowledge:No data recorded Language:Poor (patient unable to read/write)   Psychomotor Activity  Psychomotor Activity:Normal   Assets  Assets:Housing; Social Support   Sleep  Sleep:Poor  Number of hours: No data recorded  No data recorded  Physical Exam: Physical Exam Constitutional:      Appearance: Normal appearance.  HENT:     Nose: Nose normal.  Pulmonary:     Effort: Pulmonary effort is normal.  Skin:    General: Skin is warm and dry.  Neurological:     Mental Status: He is alert. He is disoriented.     Comments: Patient disoriented to time (unable to recall year).  Psychiatric:        Attention and Perception: He perceives auditory and visual hallucinations.  Mood and Affect: Mood is anxious and depressed. Affect is tearful.        Speech: Speech is tangential.        Behavior: Behavior is cooperative.        Thought  Content: Thought content is paranoid and delusional. Thought content does not include homicidal or suicidal ideation.        Cognition and Memory: Memory is impaired.        Judgment: Judgment is inappropriate.   Review of Systems  Psychiatric/Behavioral:  Positive for depression and hallucinations. The patient is nervous/anxious.   All other systems reviewed and are negative. Blood pressure (!) 161/95, pulse 84, temperature 98.4 F (36.9 C), temperature source Oral, resp. rate 16, height 5\' 2"  (1.575 m), weight 134 lb (60.8 kg), SpO2 100 %. Body mass index is 24.51 kg/m.  Musculoskeletal: Strength & Muscle Tone: within normal limits Gait & Station: normal Patient leans: N/A   [facility] MSE Discharge Disposition for Follow up and Recommendations: Based on my evaluation the patient appears to have an emergency medical condition for which I recommend the patient be transferred to the emergency department for further evaluation.    Franne Grip, NP 01/12/2021, 11:16 AM

## 2021-01-12 NOTE — ED Notes (Signed)
Pt's family member took pt's cell phone home
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
## 2021-01-12 NOTE — ED Notes (Signed)
Pt transferred to [facility] to await CT scan due to 1st onset psychosis. Safe transport was carrier. Safety maintained.

## 2021-01-12 NOTE — ED Triage Notes (Addendum)
Pt sent here by [facility] for new onset psychosis and medical clearance. Pt has no documented hx. Pt is mumbling sounds (no words) in triage, abnormal movements of head and R arm, alert to self and place only. Pt not answering questions, unable to provide any history. PT denies any complaints.

## 2021-01-13 LAB — RESP PANEL BY RT-PCR (FLU A&B, COVID) ARPGX2
Influenza A by PCR: NEGATIVE
Influenza B by PCR: NEGATIVE
SARS Coronavirus 2 by RT PCR: NEGATIVE

## 2021-01-13 LAB — RAPID URINE DRUG SCREEN, HOSP PERFORMED
Amphetamines: POSITIVE — AB
Barbiturates: NOT DETECTED
Benzodiazepines: NOT DETECTED
Cocaine: NOT DETECTED
Opiates: NOT DETECTED
Tetrahydrocannabinol: NOT DETECTED

## 2021-01-13 NOTE — ED Notes (Signed)
[phone], niece Malachi Bonds updated of pending Behavioral health consult,

## 2021-01-13 NOTE — Progress Notes (Signed)
Pt accepted to [facility] Geriatric Unit   Patient meets inpatient criteria per Dr. Lucianne Muss   The attending provider will be Carney Living, MD   Call report to [phone]  Stephania Fragmin, RN @ [facility] ED notified.     Pt scheduled  to arrive at [facility] TODAY ANYTIME.    Damita Dunnings, MSW, LCSW-A  3:08 PM 01/13/2021

## 2021-01-13 NOTE — ED Notes (Signed)
Patient in bed, stated "I'm good". Alert and oriented to self and place. Denies pain when asked. Attempted to collect urine sample, urinal provided. Informed patient of niece Malachi Bonds requesting a callback. Patient stated "later"

## 2021-01-13 NOTE — ED Notes (Signed)
Report called to Minette Headland RN at [facility] in [place]

## 2021-01-13 NOTE — Progress Notes (Signed)
CSW called [facility] to advise that pt would admit tomorrow due to transportation.   CSW called Aquilla Solian (Niece) [phone] as requested. CSW explained that CSW does not have a written constant to go into medical detail with her about pt's medical diagnosis. Malachi Bonds reports that she would like a full explanation to why pt is IVC'd. CSW provided education about the IVC process, however Malachi Bonds did not understand the process. She kept stating "well if he is good why can he not be voluntary." CSW educated the safety reason behind the IVC and shared that pt is currently not receiving any treatment and that he is only in the ED. CSW shared that pt has an accepted bed offer and will be transferred in the morning. Malachi Bonds inquired about how long pt would be in treatment at [facility]. CSW advised that she would need to follow up with that facility and obtain their consents. Malachi Bonds shared that she is worried that she will not be given the needed information to check on her uncle. CSW advised that this CSW would have 1st shift CSW give her a call in the morning to follow up.   Maryjean Ka, MSW, LCSWA 01/14/2021 12:05 AM

## 2021-01-13 NOTE — Progress Notes (Signed)
Patient has been recommended for geri psych inpatient per Dr. Lucianne Muss. Patient has been denied by [facility] due to no beds available. Patient has been faxed out to the following facilities:    [facility]  [address], [place] [address] [phone]  [facility] [address]  [address] [place] [address] [phone]  [facility]  [address] [place], [place] [address] [phone]  [facility]  [address] [place] [address] [phone]  [facility]  [address] [place], [place] [address] [phone]  [facility]  [address], [place] [address] [phone]  [facility]  [address] [place] [address] [phone]  [facility]  [address] [place] [address] [phone]  [facility]  [address] [place] [address] [phone]  [facility]  [address], [place] [address] [phone]  [facility]  [address] [place] [address] [phone]  [facility]  [address], [place] [address] [phone]  [facility]  [address], [place] [address] [phone]  [facility]  [address] [place], [place] [address] [phone]  [facility]  [address] [place] [address] [phone]   Damita Dunnings, MSW, LCSW-A  12:18 PM 01/13/2021

## 2021-01-13 NOTE — ED Notes (Signed)
Called [facility] to make aware of transfer

## 2021-01-14 NOTE — ED Notes (Signed)
RN received call from sheriff stating patient will not be able to be transported today due to staffing issues. Charge RN aware.

## 2021-01-14 NOTE — Progress Notes (Signed)
CSW followed up with Lisa/Phone#: [phone], CNA with [facility] Geriatric unit to inform the facility due to staff issues with the sheriff department the patient would not be able to transport today. Misty Stanley advised that she will call me back to inform me if the bed still could be held for the patient.   Crissie Reese, MSW, LCSW-A, LCAS-A Phone: [phone] Disposition/TOC

## 2021-01-14 NOTE — Progress Notes (Signed)
CSW followed-up with Gearldine Bienenstock with [facility] and it was reported that the bed was filled for this patient, however there will be open beds for Monday. CSW will continue to search for appropriate placement.  Crissie Reese, MSW, LCSW-A, LCAS-A Phone: [phone] Disposition/TOC

## 2021-01-14 NOTE — ED Notes (Signed)
Attempted to complete an assessment on pt. Pt stated first name and birthday. When tried to answer other questions and complete assessment pt through his hands up in the air and stated he was sick of dealing with these people. Rolled over and covered his head up with the blanket and wouldn't answer any further questions or let me complete an assessment

## 2021-01-14 NOTE — ED Notes (Signed)
Received verbal report from Katrina Y RN at this time °

## 2021-01-14 NOTE — Progress Notes (Signed)
CSW followed up with Gearldine Bienenstock from [facility] who advised that they can hold the bed for now and will advised if they have to fill the bed with a patient from there ED who would meet criteria for inpatient. Gearldine Bienenstock also advised that they will have discharges Monday as well.   Crissie Reese, MSW, LCSW-A, LCAS-A Phone: [phone] Disposition/TOC

## 2021-01-14 NOTE — ED Provider Notes (Signed)
Emergency Medicine Observation Re-evaluation Note  Michael Harper is a 60 y.o. male, seen on rounds today.  Pt initially presented to the ED for complaints of Psychiatric Evaluation Currently, the patient is awaiting Geri-psych placement.  Physical Exam  BP 125/71   Pulse 76   Temp 98 F (36.7 C)   Resp 16   SpO2 97%  Physical Exam General: Sleeping, nondistressed Cardiac: Extremities well perfused Lungs: Breathing is even and unlabored Psych: Deferred  ED Course / MDM  EKG:EKG Interpretation  Date/Time:  Friday January 13 2021 00:28:06 EST Ventricular Rate:  78 PR Interval:  150 QRS Duration: 86 QT Interval:  373 QTC Calculation: 425 R Axis:   84 Text Interpretation: Sinus rhythm Borderline right axis deviation When compared with ECG of 02/04/2014, No significant change was found Confirmed by Dione Booze ([id number]) on 01/13/2021 4:23:01 AM  I have reviewed the labs performed to date as well as medications administered while in observation.  Recent changes include following: This patient arrived 2 days ago for psychosis.  Etiology is unknown.  He reportedly has no past psychiatric history.  He has been medically cleared and evaluated by psychiatry.  They have recommended inpatient care at geriatric-psychiatric facility.  He is currently awaiting placement.  Plan  Current plan is for inpatient admission at [facility] facility. Michael Harper is under involuntary commitment.      Gloris Manchester, MD 01/14/21 2329

## 2021-01-15 NOTE — ED Notes (Signed)
Pt went and took a shower

## 2021-01-15 NOTE — ED Notes (Signed)
Received verbal report from Nichole D RN at this time 

## 2021-01-15 NOTE — Progress Notes (Signed)
Per Dr. Lucianne Muss, patient meets criteria for inpatient treatment. There are no available or appropriate beds at [facility] today, per [facility]. CSW re-faxed referrals to the following facilities for review:  [facility]  Pending - Request Sent N/A [address], [place] [address] [phone] --  [facility]  Pending - Request Sent N/A [address], [place] [address] [phone] --  [facility]  Pending - Request Sent N/A [address], [place] [address] [phone] --  [facility]  Pending - Request Sent N/A [address] [place] [address] [phone] --  [facility]  Pending - Request Sent N/A [address] [place], [place] [address] [phone] --  [facility]  Pending - Request Sent N/A [address] [place] [address] [phone] --  [facility]  Pending - Request Sent N/A [address], [place] [address] [phone] --  [facility]  Pending - Request Sent N/A [address] [place] [address] [phone] --  [facility]  Pending - Request Sent N/A [address], [place] [address] [phone] --  [facility]  Pending - Request Sent N/A [address], [place] [address] [phone] --  [facility]  Pending - Request Sent N/A [address] [place] [address] [phone] --  [facility]  Pending - Request Sent N/A [address] [place] [address] [phone] --  [facility]  Pending - Request Sent N/A [address] [place], [place] [address] [phone] --   [facility]  Pending - Request Sent N/A [address] [place], [place] [address] [phone] --  [facility]  Pending - Request Sent N/A [address] [place] [address] [phone] --  [facility]  Pending - No Request Sent N/A [address] [place] [address] [phone] --  [facility]  Pending - No Request [address] [place] [address] [phone] --  [facility]  Pending - No Request Sent N/A [address], [place] [address] [phone] --  [facility]  Pending - No Request Sent N/A [address] [place] [address] [phone] --  [facility]  Pending - No Request Sent N/A [address], [place] [address] [phone] --  [facility]  Pending - No Request Sent N/A [address] [place] [address] [phone] --  [facility]  Pending - No Request Sent N/A [address] [place] [address] [phone] --    TTS will continue to seek bed placement.  Crissie Reese, MSW, LCSW-A, LCAS-A Phone: [phone] Disposition/TOC

## 2021-01-15 NOTE — ED Notes (Signed)
Patient is resting comfortably. Sitter at bedside.  

## 2021-01-15 NOTE — ED Notes (Signed)
Michael Harper [phone] requesting to speak to the patient

## 2021-01-15 NOTE — ED Notes (Signed)
Crystal [phone] requesting to speak to the patient

## 2021-01-15 NOTE — ED Notes (Signed)
Family at bedside asking questions about what is going on, why cant she talk to the pt, why she cant visit the pt. And advises that she was told to come here and speak with someone

## 2021-01-15 NOTE — ED Notes (Signed)
Pt resting sitter ate bedside.

## 2021-01-15 NOTE — ED Notes (Addendum)
Pt resting comfortably

## 2021-01-16 ENCOUNTER — Telehealth (HOSPITAL_COMMUNITY): Payer: Self-pay

## 2021-01-16 MED ORDER — OLANZAPINE 5 MG PO TBDP
5.0000 mg | ORAL_TABLET | Freq: Every day | ORAL | Status: DC
  Administered 2021-01-16: 5 mg via ORAL
  Filled 2021-01-16: qty 1

## 2021-01-16 NOTE — BH Assessment (Signed)
Care Management - Follow Up [facility] Discharges   Patient has been placed in an inpatient psychiatric hospital [facility]) on 01-16-21

## 2021-01-16 NOTE — ED Provider Notes (Signed)
Emergency Medicine Observation Re-evaluation Note  Michael Harper is a 60 y.o. male, seen on rounds today.  Pt initially presented to the ED for complaints of Psychiatric Evaluation Currently, the patient is sleeping.  Physical Exam  BP (!) 144/81   Pulse 66   Temp 98.1 F (36.7 C) (Oral)   Resp 15   SpO2 95%  Physical Exam General: No distress Cardiac: Rate and rhythm Lungs: No increased work of breathing Psych: Sleep  ED Course / MDM  EKG:EKG Interpretation  Date/Time:  Friday January 13 2021 00:28:06 EST Ventricular Rate:  78 PR Interval:  150 QRS Duration: 86 QT Interval:  373 QTC Calculation: 425 R Axis:   84 Text Interpretation: Sinus rhythm Borderline right axis deviation When compared with ECG of 02/04/2014, No significant change was found Confirmed by Dione Booze ([id number]) on 01/13/2021 4:23:01 AM  I have reviewed the labs performed to date as well as medications administered while in observation.  Recent changes in the last 24 hours include none.  Plan  Current plan is for transfer to behavioral health facility.Bunnie Pion is under involuntary commitment.      Gerhard Munch, MD 01/16/21 1531

## 2021-01-16 NOTE — Progress Notes (Signed)
Patient has been faxed out due to previously being denied by [facility]. Patient has been recommended for geri psych placement per Dr. Lucianne Muss. Patient has been faxed out to the following facilities:   [facility]  [address] [place] [address] [phone] --  [facility] [address]  [address] [place] [address] [phone] --  [facility]  [address] [place] [address] [phone] --  [facility]  [address], [place] [address] [phone] --  [facility]  [address] [place], [place] [address] [phone] --  [facility]  [address] [place] [address] [phone] --  [facility]  [address] [place] [address] [phone] --  [facility]  [address] [place] [address] [phone] --  [facility]  [address], [place] [address] [phone] --  [facility]  [address] [place] [address] [phone] --  [facility]  [address] [place] [address] [phone] --  [facility]  [address], [place] [address] [phone] --  [facility]  [address] [place] [address] [phone] --  [facility]  [address] [place], [place] [address] [phone] --  [facility]  [address] [place] [address] [phone] --  [facility]  [address] [place] [address] [phone] --  [facility] [place] [address] [phone] --  [facility]  [address] [place], [place] [address] [phone] --  [facility]  [address] [place] [address] [phone] --  [facility]  [address] [place] [address] [phone] --  [facility]  [address] [place] [address] [phone] --  [facility]  [address] [place], [place] [address] [phone] --   Damita Dunnings, MSW, LCSW-A  10:44 AM 01/16/2021

## 2021-01-16 NOTE — ED Notes (Signed)
Pt transported via [place].  [place] complete.

## 2021-01-16 NOTE — ED Notes (Signed)
Spoke with Intake Personal, Rosanne Sack,  at [facility] regarding sending pt to facility. Pt is going to room 633. Number to call report [phone]

## 2021-01-16 NOTE — BH Assessment (Signed)
RE-ASSESSMENT This Probation officer met with patient this date to evaluate current mental health status. Patient initially presented to [facility] on 11/10 with AMS. Patient denied any S/I or H/I at that time although reported ongoing AVH stating he had "super powers." Patient denies any prior mental health diagnosis or history of OP services. Patient stated per chart review that he has been paranoid for the last few days and believed people were following him prior to admission. 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 Patient states he has been using "ice" in various amounts daily for the last week and tested positive for amphetamines this date. Patient denies any S/I or H/I at the time of re-assessment this date although is observed to continue to be altered and disorganized. Patient is difficult to redirect and gives answers to questions unrelated to the asked assessment questions. Collateral from the sitter who is at bedside reports patient has been appearing to be responding to internal stimuli. Per [place] NP patient contnues to meet inpatient criteria as bed placement is investigated.

## 2021-01-16 NOTE — ED Notes (Addendum)
Aquilla Solian niece would like to speak with SW and BH about pt status and condition. [phone]

## 2022-06-22 IMAGING — CT CT HEAD W/O CM
3 series · 16 of 47 positions shown, 19 images · non-contrast
Comparison: None.

CLINICAL DATA: Mental status change of unknown etiology.
Psychological evaluation.

EXAM:
CT HEAD WITHOUT CONTRAST
TECHNIQUE: Contiguous axial images were obtained from the base of the skull
through the vertex without intravenous contrast.

[Series 3: head 5.0 h30s · axial · 0.40mm/px · z∈[-60,+75]mm · 10 of 33 slices shown, 13 images]
[im 3/33  brain]
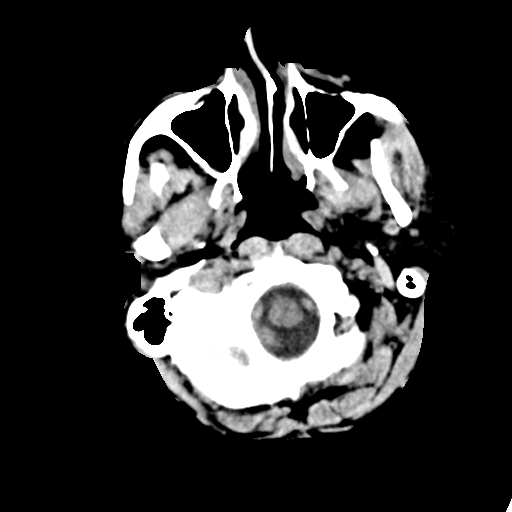
[im 3/33  bone]
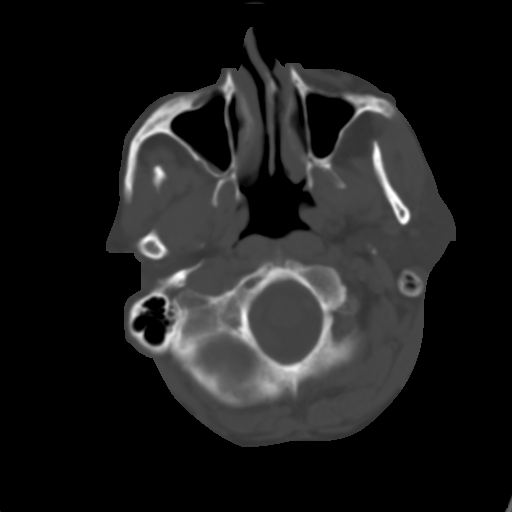
[im 6/33  brain]
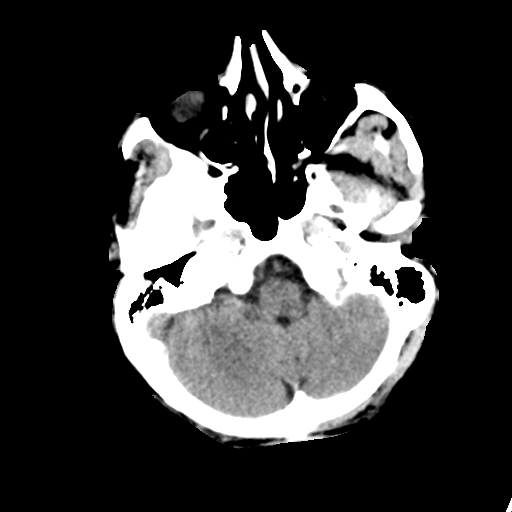
[im 9/33  brain]
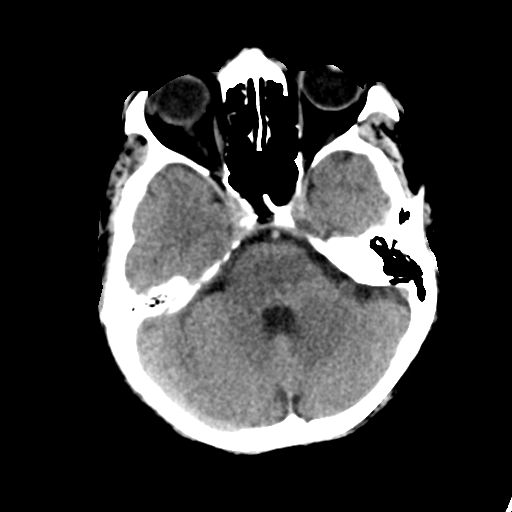
[im 12/33  brain]
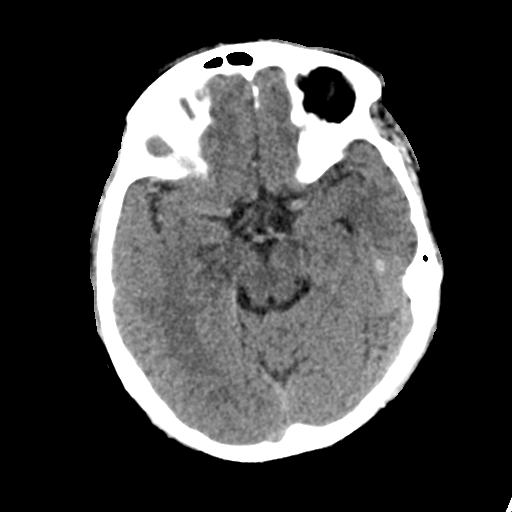
[im 15/33  brain]
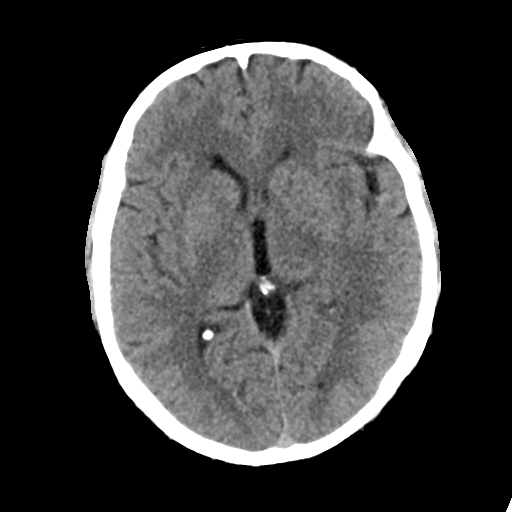
[im 15/33  bone]
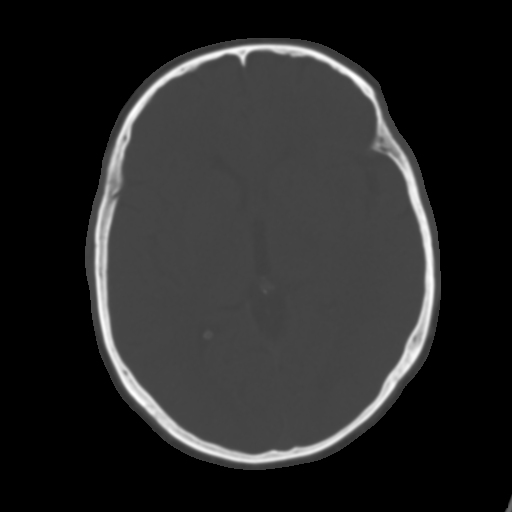
[im 18/33  brain]
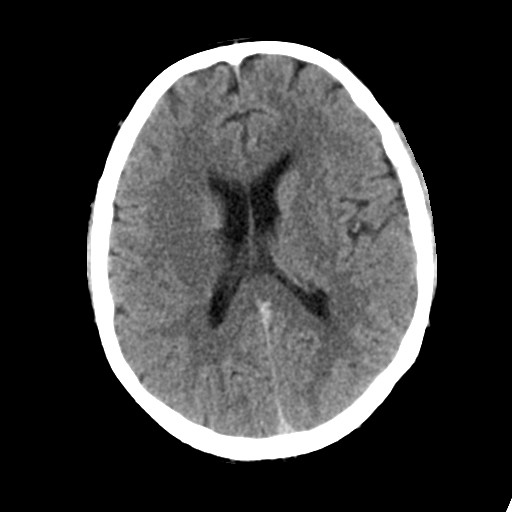
[im 21/33  brain]
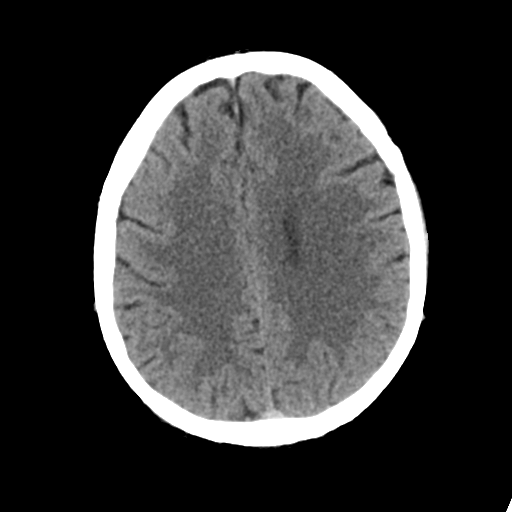
[im 25/33  brain]
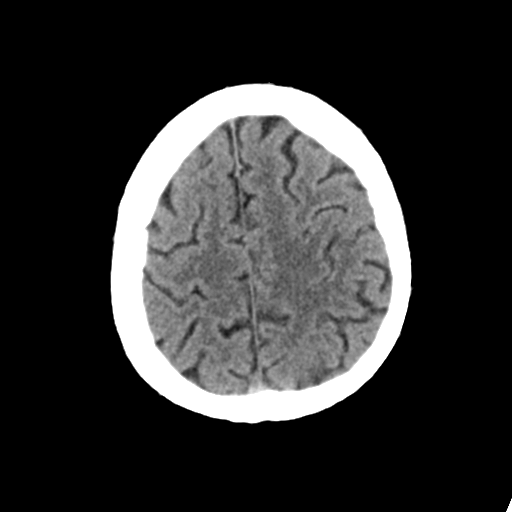
[im 27/33  brain]
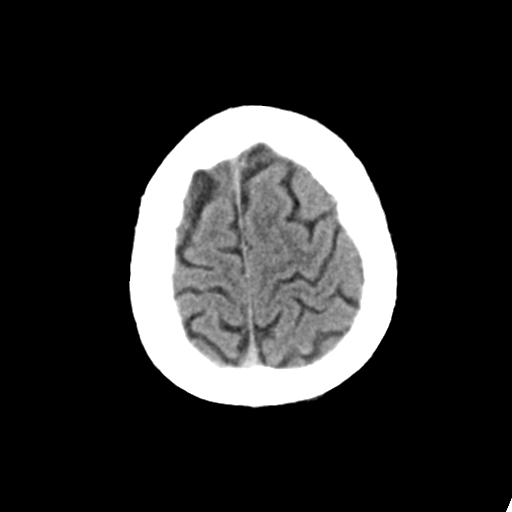
[im 27/33  bone]
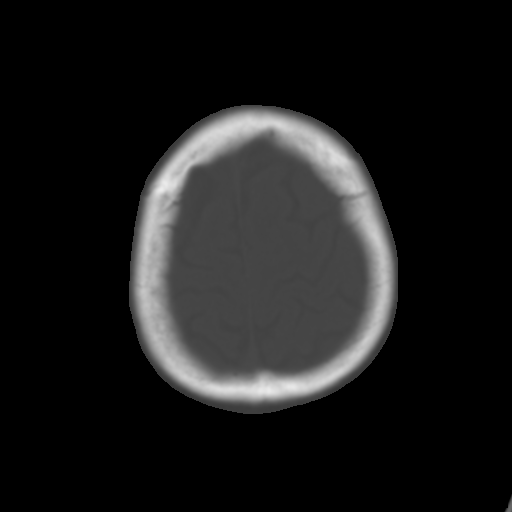
[im 30/33  brain]
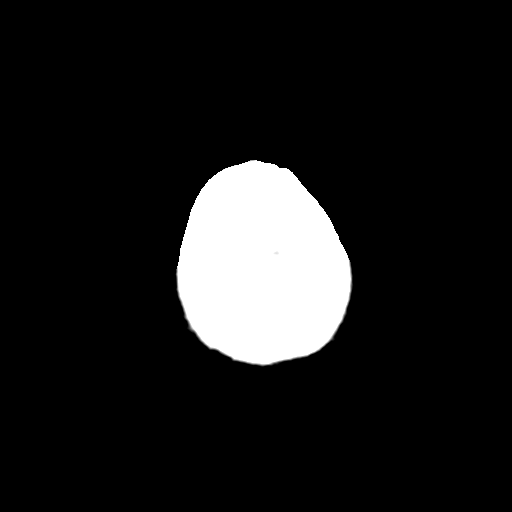

[Series 5: head 3.0 mpr cor · coronal · 0.33mm/px · 3 of 67 slices shown]
[im 23/67  brain]
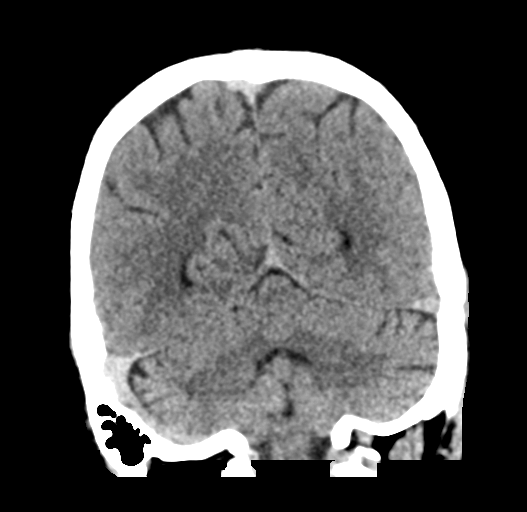
[im 30/67  brain]
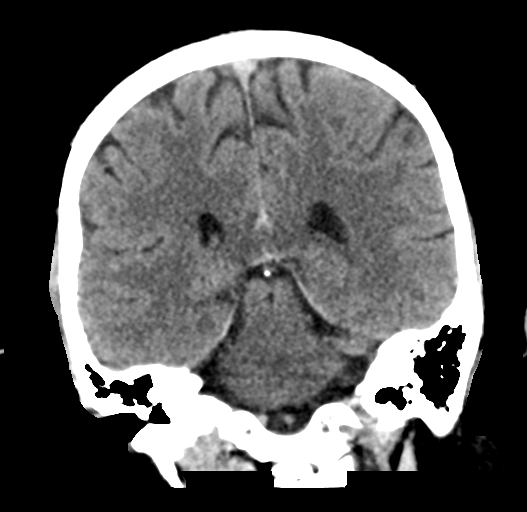
[im 37/67  brain]
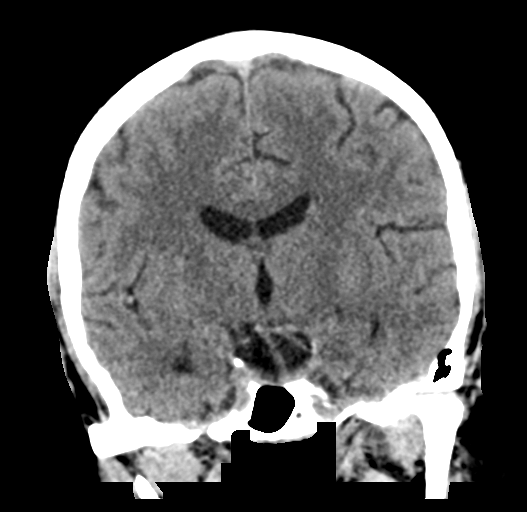

[Series 6: head 3.0 mpr sag · sagittal · 0.31mm/px · 3 of 55 slices shown]
[im 19/55  brain]
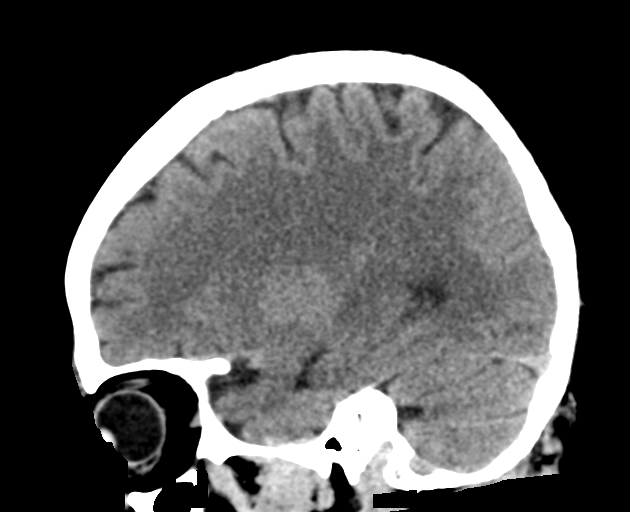
[im 28/55  brain]
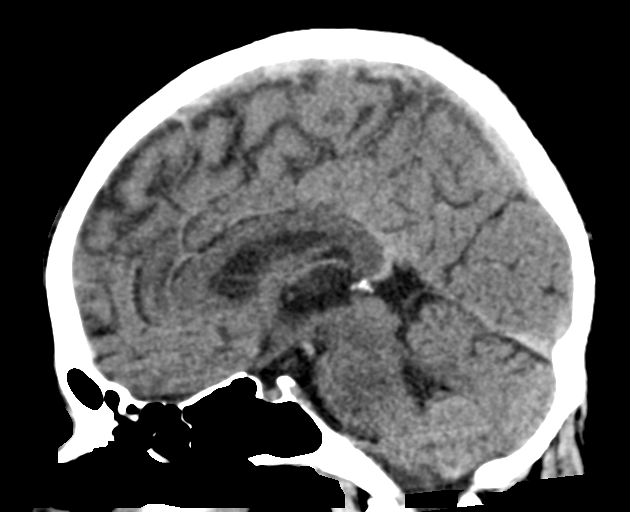
[im 37/55  brain]
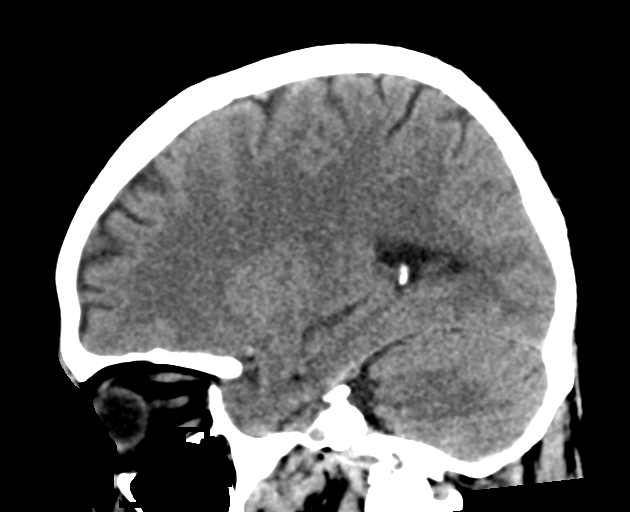

[16 of 47 positions shown; findings below may reference images not displayed]

FINDINGS: Brain: No evidence of acute infarction, hemorrhage, hydrocephalus,
extra-axial collection or mass lesion/mass effect. Focal low-density
structure within the right frontal lobe white matter is noted
compatible with subacute to chronic subcortical infarct, image [DATE].

Vascular: No hyperdense vessel or unexpected calcification.

Skull: Normal. Negative for fracture or focal lesion.

Sinuses/Orbits: Mild mucosal thickening involving the right
maxillary sinus. Mastoid air cells are clear

Other: None
IMPRESSION: 1. No acute intracranial abnormalities.
2. Subacute to chronic right frontal lobe subcortical infarct.

## 2022-10-16 ENCOUNTER — Emergency Department (HOSPITAL_COMMUNITY)
Admission: EM | Admit: 2022-10-16 | Discharge: 2022-10-17 | Disposition: A | Discharge status: Home / Self Care | Payer: Self-pay | Attending: Emergency Medicine | Admitting: Emergency Medicine

## 2022-10-16 ENCOUNTER — Emergency Department (HOSPITAL_COMMUNITY): Payer: Self-pay

## 2022-10-16 DIAGNOSIS — S21212A Laceration without foreign body of left back wall of thorax without penetration into thoracic cavity, initial encounter: Secondary | ICD-10-CM | POA: Insufficient documentation

## 2022-10-16 DIAGNOSIS — T148XXA Other injury of unspecified body region, initial encounter: Secondary | ICD-10-CM
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
## 2022-10-17 ENCOUNTER — Emergency Department (HOSPITAL_COMMUNITY): Payer: Self-pay

## 2022-10-17 MED ORDER — IOHEXOL 350 MG/ML SOLN
75.0000 mL | Freq: Once | INTRAVENOUS | Status: AC | PRN
  Administered 2022-10-17: 75 mL via INTRAVENOUS
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
## 2022-10-17 NOTE — ED Notes (Signed)
Wound cleaned and dressed.
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
